# Patient Record
Sex: Female | Born: 1965 | Race: White | Hispanic: No | Marital: Married | Smoking: Current every day smoker
Health system: Southern US, Community
[De-identification: ages and names within clinical notes are randomized; demographics above are authoritative.]

## PROBLEM LIST (undated history)

## (undated) DIAGNOSIS — G35 Multiple sclerosis: Secondary | ICD-10-CM

## (undated) DIAGNOSIS — K219 Gastro-esophageal reflux disease without esophagitis: Secondary | ICD-10-CM

## (undated) DIAGNOSIS — N289 Disorder of kidney and ureter, unspecified: Secondary | ICD-10-CM

## (undated) DIAGNOSIS — F32A Depression, unspecified: Secondary | ICD-10-CM

## (undated) DIAGNOSIS — F419 Anxiety disorder, unspecified: Secondary | ICD-10-CM

## (undated) DIAGNOSIS — R413 Other amnesia: Secondary | ICD-10-CM

## (undated) DIAGNOSIS — E7800 Pure hypercholesterolemia, unspecified: Secondary | ICD-10-CM

## (undated) DIAGNOSIS — H539 Unspecified visual disturbance: Secondary | ICD-10-CM

## (undated) DIAGNOSIS — F329 Major depressive disorder, single episode, unspecified: Secondary | ICD-10-CM

## (undated) DIAGNOSIS — G500 Trigeminal neuralgia: Secondary | ICD-10-CM

## (undated) HISTORY — DX: Multiple sclerosis: G35

## (undated) HISTORY — DX: Anxiety disorder, unspecified: F41.9

## (undated) HISTORY — DX: Gastro-esophageal reflux disease without esophagitis: K21.9

## (undated) HISTORY — PX: ABDOMINAL HYSTERECTOMY: SHX81

## (undated) HISTORY — DX: Other amnesia: R41.3

## (undated) HISTORY — DX: Trigeminal neuralgia: G50.0

## (undated) HISTORY — DX: Pure hypercholesterolemia, unspecified: E78.00

## (undated) HISTORY — DX: Depression, unspecified: F32.A

## (undated) HISTORY — DX: Unspecified visual disturbance: H53.9

## (undated) HISTORY — DX: Major depressive disorder, single episode, unspecified: F32.9

---

## 2008-04-03 ENCOUNTER — Ambulatory Visit: Payer: Self-pay | Admitting: Cardiology

## 2008-04-14 ENCOUNTER — Ambulatory Visit: Payer: Self-pay | Admitting: Cardiology

## 2008-04-28 ENCOUNTER — Ambulatory Visit: Payer: Self-pay | Admitting: Cardiology

## 2010-11-29 NOTE — Assessment & Plan Note (Signed)
[facility]                          [facility] NOTE   MAEBELL, LYVERS                      MRN:          [id number]  DATE:04/03/2008                            DOB:          02/04/1966    Mrs. Megan Chan is a pleasant 45 year old female who I am asked to evaluate  for chest pain.  She has no prior cardiac history.  Note, this year she  has been diagnosed with multiple sclerosis.  She also has a trigeminal  neuralgia.  She has had pain in her chest since approximately January.  The pain is more in the right chest area and is described as a  heaviness.  It also radiates to the neck.  She has some shortness of  breath, but there is no nausea, vomiting, or diaphoresis.  Pain is not  pleuritic or positional nor is it related to food.  It is not clearly  exertional.  It can last anywhere from 2 hours to all day at a time.  There is no relieving factors.  She was seen by Dr. Neita Carp, and  electrocardiogram was felt to be abnormal.  We were asked to further  evaluate.  Note, she also has some dyspnea on exertion, but there is no  orthopnea, PND, or pedal edema.  She has not had syncope.   Her medications include also Betaseron 0.3 mg p.o. every other day,  fluoxetine 20 mg p.o. daily, gabapentin 300 mg p.o. t.i.d., Pravachol 40  mg p.o. nightly, prenatal vitamins, vitamin B12, vitamin C, salmon oil,  Caltrate, and Tylenol.   Her allergies include PENICILLIN, SULFA, and CODEINE.   SOCIAL HISTORY:  She does smoke.  She states she used to occasionally  consume alcohol, she does not consume any at this point.  She is  married, but has no children.   Her family history is negative for coronary artery disease in her  immediate family.   PAST MEDICAL HISTORY:  There is no diabetes mellitus or hypertension,  but she does have a history of hyperlipidemia.  She was recently  diagnosed with multiple sclerosis and has a history of trigeminal  neuralgia.  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  She states she has not been able to have children and has had a  previous evaluation for this.  She has had no previous surgeries.   REVIEW OF SYSTEMS:  She has not had headaches or fevers or chills.  She  has had some problems with pain in the right base described as pin  sensation related to trigeminal neuralgia.  She does have a cough, but  there is no hemoptysis.  There is no dysphagia, odynophagia, melena, or  hematochezia.  There is no dysuria or  hematuria.  There is no rash or  seizure activity.  There is no orthopnea, PND, or pedal edema.  She has  diffuse pains from her multiple sclerosis.  The remaining systems are  negative.   PHYSICAL EXAMINATION:  VITAL SIGNS:  Today shows a blood pressure 128/73  and her pulse is 69.  She  weighs 115 pounds.  GENERAL:  She is well developed and well nourished in no distress.  SKIN:  Warm and dry.  She does not appear to be depressed.  There is no  peripheral clubbing  BACK:  Normal.  HEENT:  Normal with normal eyelids.  NECK:  Supple with a normal upstroke bilaterally.  No bruits noted.  There is no jugular venous distention, and I cannot appreciate  thyromegaly.  CHEST:  Clear to auscultation.  No expansion.  CARDIOVASCULAR:  Regular rare and rhythm.  Normal S1 and S2.  There are  no murmurs, rubs, or gallops noted.  ABDOMEN:  Nontender, nondistended.  Positive bowel sounds.  No  hepatosplenomegaly.  No mass appreciated.  There is no abdominal bruit.  EXTREMITIES:  She has 2+ femoral pulses bilaterally.  No bruits.  Extremities show no edema and I can palpate no cords.  She has 2+  dorsalis pedis pulses bilaterally.  NEUROLOGIC:  Grossly intact.   I have reviewed her electrocardiogram from February 25, 2008.  This showed  a normal sinus rhythm at a rate of 58.  The axis is normal.  A prior  septal infarct cannot be excluded.  There are no ST changes  noted.   DIAGNOSES:  1. Chest pain - the patient's symptoms are atypical, it may be related      to multiple sclerosis.  I think we can proceed with a stress      echocardiogram for risk stratification particularly in light of her      dyspnea.  If it is normal, then I would not pursue further cardiac      evaluation.  2. Multiple sclerosis - management per her neurologist.  3. Abnormal electrocardiogram - as per above, we will plan a stress      echocardiogram.  It is normal, we will not pursue further      evaluation.  4. Hyperlipidemia - she will continue her statin and this is being      managed by Dr. Neita Carp.   We will see her back in 4 weeks to review her stress echo with her.     Megan Frieze Jens Som, MD, [facility]  Electronically Signed    BSC/MedQ  DD: 04/03/2008  DT: 04/04/2008  Job #: [id number]   cc:   Fara Chute

## 2010-11-29 NOTE — Assessment & Plan Note (Signed)
[facility]                          [facility]   [name], Chan                      MRN:          [id number]  DATE:04/28/2008                            DOB:          [birth year]    PRIMARY CARDIOLOGIST:  Madolyn Frieze. Jens Som, MD, [facility]   PRIMARY CARE PHYSICIAN:  Fara Chute, MD   REASON FOR VISIT:  Followup cardiac testing.   HISTORY OF PRESENT ILLNESS:  [name] was seen back in September by  Dr. Jens Som with a history of atypical chest pain in the setting of  multiple sclerosis and a resting electrocardiogram demonstrating sinus  rhythm with poor septal R-wave progression.  She was referred for an  exercise echocardiogram which was performed on April 14, 2008.  The  patient achieved a maximum workload of 10 METS without chest pain and  had a negative electrocardiographic response.  She also had normal left  ventricular systolic function with no inducible wall motion  abnormalities to suggest ischemia echocardiographically.  I have  reviewed these very reassuring results with the patient today, and at  this point, I would not anticipate any further cardiac evaluation.  She  was very comfortable with this.   ALLERGIES:  PENICILLIN, SULFA drugs, and CODEINE.   PRESENT MEDICATIONS:  1. Betaseron 0.3 mg p.o. every other day.  2. Fluoxetine 20 mg p.o. daily.  3. Gabapentin 300 mg p.o. t.i.d.  4. Pravastatin 40 mg p.o. nightly.  5. Prenatal vitamin daily.  6. Vitamin B12 1000 mg daily.  7. Vitamin C 1000 mg daily.  8. Salmon oil 1000 mg p.o. b.i.d.  9. Caltrate with vitamin D 600 mg p.o. b.i.d.  10.Extra-strength Tylenol 500 mg 2 tablets p.o. every other day.   REVIEW OF SYSTEMS:  As described in the history of present illness,  otherwise negative.   PHYSICAL EXAMINATION:  Blood pressure 123/72, heart rate is 67, weight  is 118 pounds.  Otherwise, no significant changes.   IMPRESSION AND RECOMMENDATIONS:  Atypical  chest pain with reassuring  cardiac evaluation including a normal exercise echocardiogram.  At this  point, we would not anticipate any further cardiac evaluation.  She will  continue followup with her neurologist for ongoing  management of multiple sclerosis and also with Dr. Neita Carp.  Cardiology  followup can be as needed.     Jonelle Sidle, MD  Electronically Signed    SGM/MedQ  DD: 04/28/2008  DT: 04/29/2008  Job #: [id number]   cc:   Madolyn Frieze. Jens Som, MD, [facility]  Fara Chute

## 2013-07-17 HISTORY — PX: COLONOSCOPY: SHX174

## 2014-08-25 ENCOUNTER — Encounter: Payer: Self-pay | Admitting: Neurology

## 2014-08-25 ENCOUNTER — Ambulatory Visit (INDEPENDENT_AMBULATORY_CARE_PROVIDER_SITE_OTHER): Payer: 59 | Admitting: Neurology

## 2014-08-25 VITALS — BP 128/72 | HR 66 | Resp 14 | Ht 63.0 in | Wt 137.2 lb

## 2014-08-25 DIAGNOSIS — G501 Atypical facial pain: Secondary | ICD-10-CM

## 2014-08-25 DIAGNOSIS — R350 Frequency of micturition: Secondary | ICD-10-CM

## 2014-08-25 DIAGNOSIS — F418 Other specified anxiety disorders: Secondary | ICD-10-CM | POA: Insufficient documentation

## 2014-08-25 DIAGNOSIS — R5383 Other fatigue: Secondary | ICD-10-CM

## 2014-08-25 DIAGNOSIS — G35 Multiple sclerosis: Secondary | ICD-10-CM

## 2014-08-25 DIAGNOSIS — Z79899 Other long term (current) drug therapy: Secondary | ICD-10-CM

## 2014-08-25 DIAGNOSIS — R261 Paralytic gait: Secondary | ICD-10-CM

## 2014-08-25 MED ORDER — IMIPRAMINE HCL 25 MG PO TABS: 25.0000 mg | ORAL_TABLET | Freq: Every day | ORAL | Status: DC
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
## 2014-08-26 LAB — CBC WITH DIFFERENTIAL/PLATELET
BASOS ABS: 0 10*3/uL (ref 0.0–0.2)
Basos: 0 %
EOS ABS: 0.2 10*3/uL (ref 0.0–0.4)
Eos: 2 %
HCT: 41.4 % (ref 34.0–46.6)
Hemoglobin: 14.2 g/dL (ref 11.1–15.9)
IMMATURE GRANS (ABS): 0 10*3/uL (ref 0.0–0.1)
IMMATURE GRANULOCYTES: 0 %
LYMPHS: 33 %
Lymphocytes Absolute: 3.6 10*3/uL — ABNORMAL HIGH (ref 0.7–3.1)
MCH: 32.3 pg (ref 26.6–33.0)
MCHC: 34.3 g/dL (ref 31.5–35.7)
MCV: 94 fL (ref 79–97)
MONOS ABS: 0.6 10*3/uL (ref 0.1–0.9)
Monocytes: 6 %
NEUTROS PCT: 59 %
Neutrophils Absolute: 6.3 10*3/uL (ref 1.4–7.0)
PLATELETS: 229 10*3/uL (ref 150–379)
RBC: 4.4 x10E6/uL (ref 3.77–5.28)
RDW: 14.2 % (ref 12.3–15.4)
WBC: 10.8 10*3/uL (ref 3.4–10.8)

## 2014-08-26 LAB — COMPREHENSIVE METABOLIC PANEL
ALT: 28 IU/L (ref 0–32)
AST: 23 IU/L (ref 0–40)
Albumin/Globulin Ratio: 1.9 (ref 1.1–2.5)
Albumin: 4.6 g/dL (ref 3.5–5.5)
Alkaline Phosphatase: 116 IU/L (ref 39–117)
BILIRUBIN TOTAL: 0.4 mg/dL (ref 0.0–1.2)
BUN/Creatinine Ratio: 7 — ABNORMAL LOW (ref 9–23)
BUN: 5 mg/dL — ABNORMAL LOW (ref 6–24)
CALCIUM: 9.7 mg/dL (ref 8.7–10.2)
CO2: 25 mmol/L (ref 18–29)
Chloride: 100 mmol/L (ref 97–108)
Creatinine, Ser: 0.75 mg/dL (ref 0.57–1.00)
GFR, EST AFRICAN AMERICAN: 110 mL/min/{1.73_m2} (ref >59)
GFR, EST NON AFRICAN AMERICAN: 95 mL/min/{1.73_m2} (ref >59)
GLOBULIN, TOTAL: 2.4 g/dL (ref 1.5–4.5)
GLUCOSE: 84 mg/dL (ref 65–99)
POTASSIUM: 4.5 mmol/L (ref 3.5–5.2)
SODIUM: 139 mmol/L (ref 134–144)
Total Protein: 7 g/dL (ref 6.0–8.5)

## 2014-08-28 LAB — QUANTIFERON IN TUBE
QFT TB AG MINUS NIL VALUE: 0.01 [IU]/mL
QUANTIFERON MITOGEN VALUE: 7.64 IU/mL
QUANTIFERON TB AG VALUE: 0.02 IU/mL
QUANTIFERON TB GOLD: NEGATIVE
Quantiferon Nil Value: 0.01 IU/mL

## 2014-08-28 LAB — QUANTIFERON TB GOLD ASSAY (BLOOD)

## 2014-09-01 ENCOUNTER — Encounter: Payer: Self-pay | Admitting: *Deleted

## 2014-09-01 ENCOUNTER — Telehealth: Payer: Self-pay | Admitting: *Deleted

## 2014-09-01 NOTE — Telephone Encounter (Signed)
Spoke with Thayer Headings and per RAS, advised labs are ok, I have faxed Aubagio start form in to [place].  She verbalized understanding of same/fim

## 2014-09-03 ENCOUNTER — Ambulatory Visit (INDEPENDENT_AMBULATORY_CARE_PROVIDER_SITE_OTHER): Payer: 59

## 2014-09-03 DIAGNOSIS — G35 Multiple sclerosis: Secondary | ICD-10-CM

## 2014-09-03 DIAGNOSIS — R261 Paralytic gait: Secondary | ICD-10-CM

## 2014-09-03 DIAGNOSIS — Z79899 Other long term (current) drug therapy: Secondary | ICD-10-CM

## 2014-09-03 MED ORDER — GADOPENTETATE DIMEGLUMINE 469.01 MG/ML IV SOLN: 13.0000 mL | Freq: Once | INTRAVENOUS | Status: AC | PRN

## 2014-09-04 ENCOUNTER — Telehealth: Payer: Self-pay | Admitting: Neurology

## 2014-09-04 NOTE — Telephone Encounter (Signed)
I LM   MRI showed some new lesions so good she is going back on a medication .Marland Kitchen  If she does not get her med's within 2 weeks let us know

## 2014-09-14 ENCOUNTER — Telehealth: Payer: Self-pay | Admitting: Neurology

## 2014-09-14 NOTE — Telephone Encounter (Signed)
Spoke with Thayer Headings.  I will check with Aubagio to see if they need anything else from [place].  She has questions regarding her last mri--RAS left a message for her.  I will let him know she is available at [phone] to discuss mri./fim

## 2014-09-14 NOTE — Telephone Encounter (Signed)
The pt is calling stating that her insurance will not pay for Aubagio she needs for Dr. Felecia Shelling to contact [facility].  Also pt has questions regarding her MRI results and the message that was left.  Please call and advise.

## 2014-09-14 NOTE — Telephone Encounter (Signed)
I spoke to Megan Chan about her MRI (showing 4 brand new lesions --- off any med x 9 months).  We are having some trouble getting Aubagio but she has another week of samples from [facility].       Is there something I need to fill out or person to call so we can get Aubagio -- if they won't conver despite appeal, we will need to have her do something else

## 2014-09-15 NOTE — Telephone Encounter (Signed)
Angelic with [facility] @ [phone], x [phone], stated she faxed on 2/26 outcome of prior authorization with [employer].  She stated form needs to be completed and faxed back.  Please call and advise.

## 2014-09-15 NOTE — Telephone Encounter (Signed)
I called back.  Spoke with Reece Levy who transferred me to [place].  She requested we send a copy of PA form sent to ins on 02/25 to her at [phone].  She indicated the patient has been approved for the co-pay assist program effective until 09/07/2015 Co-pay ID # [id number] Group # [id number] and BIN # [id number].

## 2014-09-15 NOTE — Telephone Encounter (Signed)
Spoke with Angelique with [facility]--she sts. she needs update on status of pa for Aubagio.  I will ask Janett Billow for this/fim

## 2014-09-15 NOTE — Telephone Encounter (Signed)
LMOM for Megan Chan (identified vm) that I have spoken with Megan Chan at [place] [facility]--she sts. pa for Megan Chan has been submitted to Megan Chan's ins--just waiting for it to go thru.  I will call Megan Chan as soon as I have another update/fim

## 2014-09-17 NOTE — Telephone Encounter (Signed)
Prior auth for Megan Chan was approved by ins effective until 09/15/2014 Ref # [id number].  Angelique at [facility] is aware, and they are contacting the patient.

## 2014-09-17 NOTE — Telephone Encounter (Signed)
Please disregard previous note.

## 2014-09-18 ENCOUNTER — Telehealth: Payer: Self-pay | Admitting: Neurology

## 2014-09-18 NOTE — Telephone Encounter (Signed)
Pt is calling wanting to know if imipramine (TOFRANIL) 25 MG tablet causes a yeast infection?  She has had vaginal dryness and pain and some bleeding.  Vaginal area was swollen and very dry or could it be the Aubagio. This did not start until she started these 2 medications.  She did but some OTC meds for yeast infection, but now is having some itching problems now.  She stopped taking the imipramine. Please call and advise.

## 2014-09-18 NOTE — Telephone Encounter (Signed)
Spoke with Thayer Headings and per RAS, advised vaginal sx. likely not due to Aubagio or Imipramine.  She verbalized understanding of same, asked for med for yeast infection to be called in.  I advised she would need to see her ob/gyn or pcp for this/fim

## 2014-09-18 NOTE — Telephone Encounter (Signed)
Ins has approved the request for Aubagio effective until 45/62/5638 or until policy changes or is terminated Ref # [id number].  Ryanne from [place] has reached out to the patient.

## 2014-11-19 ENCOUNTER — Telehealth: Payer: Self-pay | Admitting: *Deleted

## 2014-11-19 DIAGNOSIS — G35 Multiple sclerosis: Secondary | ICD-10-CM

## 2014-11-19 DIAGNOSIS — Z79899 Other long term (current) drug therapy: Secondary | ICD-10-CM

## 2014-11-19 NOTE — Telephone Encounter (Signed)
I have spoken with Megan Chan this am--she would like liver panel checked at pcp's office--Dr. Consuello Masse at [facility] in [place].  I advised this is ok--I have spoken with Dr. Edythe Lynn office and they are agreeable to drawing labs tomorrow at 1115.  Megan Chan aware of appt., is agreeable.  Order for liver panel faxed to Dr. Edythe Lynn office at fax# [phone]/fim

## 2014-11-24 ENCOUNTER — Encounter: Payer: Self-pay | Admitting: Neurology

## 2014-11-24 ENCOUNTER — Ambulatory Visit (INDEPENDENT_AMBULATORY_CARE_PROVIDER_SITE_OTHER): Payer: 59 | Admitting: Neurology

## 2014-11-24 ENCOUNTER — Other Ambulatory Visit: Payer: Self-pay | Admitting: *Deleted

## 2014-11-24 VITALS — BP 124/60 | HR 68 | Resp 14 | Ht 64.0 in | Wt 136.4 lb

## 2014-11-24 DIAGNOSIS — Z79899 Other long term (current) drug therapy: Secondary | ICD-10-CM

## 2014-11-24 DIAGNOSIS — G501 Atypical facial pain: Secondary | ICD-10-CM | POA: Diagnosis not present

## 2014-11-24 DIAGNOSIS — R5383 Other fatigue: Secondary | ICD-10-CM | POA: Diagnosis not present

## 2014-11-24 DIAGNOSIS — M542 Cervicalgia: Secondary | ICD-10-CM | POA: Diagnosis not present

## 2014-11-24 DIAGNOSIS — F418 Other specified anxiety disorders: Secondary | ICD-10-CM

## 2014-11-24 DIAGNOSIS — R261 Paralytic gait: Secondary | ICD-10-CM

## 2014-11-24 DIAGNOSIS — G35 Multiple sclerosis: Secondary | ICD-10-CM

## 2014-11-24 DIAGNOSIS — R350 Frequency of micturition: Secondary | ICD-10-CM

## 2014-11-24 MED ORDER — GABAPENTIN 600 MG PO TABS: 600.0000 mg | ORAL_TABLET | Freq: Four times a day (QID) | ORAL | Status: DC

## 2014-11-24 MED ORDER — LAMOTRIGINE 25 MG PO TABS: ORAL_TABLET | ORAL | Status: DC

## 2014-11-24 MED ORDER — LAMOTRIGINE 100 MG PO TABS: 100.0000 mg | ORAL_TABLET | Freq: Every day | ORAL | Status: DC

## 2014-11-24 NOTE — Patient Instructions (Signed)
Lamotrigine 25 mg: Take one pill daily x 1 week, then one pill twice daily x 1 week, then one pill three times daily x 1 week, then 2 pills twice daily x 1 week, then start other prescription  Then go up to 100 mg tablet twice daily  Reduce Aubagio to other day.   Recheck  bloodwork in 3 weeks
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
## 2014-12-11 ENCOUNTER — Telehealth: Payer: Self-pay | Admitting: Neurology

## 2014-12-11 MED ORDER — LAMOTRIGINE 100 MG PO TABS: ORAL_TABLET | ORAL | Status: DC

## 2014-12-11 NOTE — Telephone Encounter (Signed)
Patient called wanting to clarify the dosage she is suppose to be taking for lamoTRIgine (LAMICTAL) 100 MG tablet. Please call and advise. Patient can be reached @ [phone]

## 2014-12-11 NOTE — Telephone Encounter (Signed)
I have spoken with Megan Chan this am.  She was to titrate Lamictal up to 100mg  bid.  Written rx. has a quantity of #60 but instructions say to take one tab daily.  She understands to take one tab bid but is concerned ins. will not cover #60 with current instructions, and she is probably right.  I have escribed a new rx. to [facility] in [place] for her./fim

## 2015-01-19 ENCOUNTER — Telehealth: Payer: Self-pay | Admitting: Neurology

## 2015-01-19 NOTE — Telephone Encounter (Signed)
Pt called wanting the  start dates on lamoTRIgine (LAMICTAL) 100 MG tablet,Teriflunomide 14 MG TABS, pt needs to know the reason for taking them as well. Please call and advise. [phone]

## 2015-01-19 NOTE — Telephone Encounter (Signed)
I called back.  Answered patient's questions.  She will call back if anything further is needed.

## 2015-01-26 ENCOUNTER — Telehealth: Payer: Self-pay | Admitting: Neurology

## 2015-01-26 NOTE — Telephone Encounter (Signed)
Pt called wondering about her medication, she is very confused about this. Please call back and advise [phone]

## 2015-01-26 NOTE — Telephone Encounter (Signed)
I have spoken with Megan Chan this afternoon-she is having a new set of lft's drawn at her pcp's office this week--currently she is taking Aubagio qod, would like to know if she should increase to daily if lft's are normal.  I will watch for results and check with RAS when they arrive/fim

## 2015-02-01 ENCOUNTER — Telehealth: Payer: Self-pay | Admitting: *Deleted

## 2015-02-01 NOTE — Telephone Encounter (Signed)
Received labs (liver profile) from pcp Dr. Quintin Alto.  Total protein is 6.3     Serum albumin is 4.4      Total bilirubin is 0.3      Direct bilirubin is 0.10      Alkaline Phosphatase is high at 139.     AST is 26     ALT is 25.  I have spoken with Megan Chan and per RAS, advised that labs are ok; she should continue Aubagio qod until she sees him back for follow up, then will re-eval and may go back to taking Aubagio daily.  She verbalized understanding of same/fim

## 2015-02-25 ENCOUNTER — Ambulatory Visit: Payer: 59 | Admitting: Neurology

## 2015-03-11 ENCOUNTER — Other Ambulatory Visit: Payer: Self-pay | Admitting: *Deleted

## 2015-03-11 ENCOUNTER — Ambulatory Visit (INDEPENDENT_AMBULATORY_CARE_PROVIDER_SITE_OTHER): Payer: Medicare Other | Admitting: Neurology

## 2015-03-11 ENCOUNTER — Encounter: Payer: Self-pay | Admitting: Neurology

## 2015-03-11 VITALS — BP 118/62 | HR 76 | Resp 14 | Ht 64.0 in | Wt 130.6 lb

## 2015-03-11 DIAGNOSIS — G35 Multiple sclerosis: Secondary | ICD-10-CM

## 2015-03-11 DIAGNOSIS — R5383 Other fatigue: Secondary | ICD-10-CM

## 2015-03-11 DIAGNOSIS — Z79899 Other long term (current) drug therapy: Secondary | ICD-10-CM

## 2015-03-11 DIAGNOSIS — R350 Frequency of micturition: Secondary | ICD-10-CM | POA: Diagnosis not present

## 2015-03-11 DIAGNOSIS — G501 Atypical facial pain: Secondary | ICD-10-CM

## 2015-03-11 DIAGNOSIS — R261 Paralytic gait: Secondary | ICD-10-CM

## 2015-03-11 MED ORDER — CYCLOBENZAPRINE HCL 5 MG PO TABS: 5.0000 mg | ORAL_TABLET | Freq: Three times a day (TID) | ORAL | Status: DC | PRN

## 2015-03-11 MED ORDER — LAMOTRIGINE 150 MG PO TABS: ORAL_TABLET | ORAL | Status: DC
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
## 2015-03-12 ENCOUNTER — Telehealth: Payer: Self-pay

## 2015-03-12 ENCOUNTER — Telehealth: Payer: Self-pay | Admitting: *Deleted

## 2015-03-12 ENCOUNTER — Other Ambulatory Visit: Payer: Self-pay | Admitting: Neurology

## 2015-03-12 LAB — CBC WITH DIFFERENTIAL/PLATELET
BASOS: 0 %
Basophils Absolute: 0 10*3/uL (ref 0.0–0.2)
EOS (ABSOLUTE): 0.2 10*3/uL (ref 0.0–0.4)
EOS: 4 %
HEMOGLOBIN: 13.7 g/dL (ref 11.1–15.9)
Hematocrit: 41.3 % (ref 34.0–46.6)
IMMATURE GRANS (ABS): 0 10*3/uL (ref 0.0–0.1)
IMMATURE GRANULOCYTES: 0 %
LYMPHS: 36 %
Lymphocytes Absolute: 2.2 10*3/uL (ref 0.7–3.1)
MCH: 30.6 pg (ref 26.6–33.0)
MCHC: 33.2 g/dL (ref 31.5–35.7)
MCV: 92 fL (ref 79–97)
MONOCYTES: 10 %
Monocytes Absolute: 0.6 10*3/uL (ref 0.1–0.9)
NEUTROS ABS: 3 10*3/uL (ref 1.4–7.0)
NEUTROS PCT: 50 %
PLATELETS: 254 10*3/uL (ref 150–379)
RBC: 4.47 x10E6/uL (ref 3.77–5.28)
RDW: 14.1 % (ref 12.3–15.4)
WBC: 6 10*3/uL (ref 3.4–10.8)

## 2015-03-12 LAB — HEPATIC FUNCTION PANEL
ALK PHOS: 171 IU/L — AB (ref 39–117)
ALT: 43 IU/L — AB (ref 0–32)
AST: 35 IU/L (ref 0–40)
Albumin: 4.4 g/dL (ref 3.5–5.5)
BILIRUBIN TOTAL: 0.3 mg/dL (ref 0.0–1.2)
BILIRUBIN, DIRECT: 0.1 mg/dL (ref 0.00–0.40)
Total Protein: 6.8 g/dL (ref 6.0–8.5)

## 2015-03-12 MED ORDER — LAMOTRIGINE 100 MG PO TABS: ORAL_TABLET | ORAL | Status: DC

## 2015-03-12 MED ORDER — LAMOTRIGINE 150 MG PO TABS: ORAL_TABLET | ORAL | Status: DC

## 2015-03-12 NOTE — Telephone Encounter (Signed)
LMOM identified vm that per RAS, lft's are still slightly high, so she should continue Aubagio qod for now, will recheck at next ov.  She does not need to return this call unless she has questions/fim

## 2015-03-12 NOTE — Telephone Encounter (Signed)
Per ov note yesterday, RAS ordered increase in lamictal to 200mg  po bid.  I have escribed this rx. to [facility]/fim

## 2015-03-12 NOTE — Addendum Note (Signed)
Addended by: France Ravens I on: 03/12/2015 10:32 AM   Modules accepted: Orders

## 2015-03-12 NOTE — Telephone Encounter (Signed)
Informed patient of slightly elevated Liver test and to remain on Aubagio every other day.  Also that we would re check liver test on her next visit;  she verbalized understanding.  Patient is requesting script for the increase of Lamotrigine 200mg  PO BID.

## 2015-03-12 NOTE — Telephone Encounter (Signed)
-----   Message from Britt Bottom, MD sent at 03/12/2015  8:40 AM EDT ----- Liver tests is still slightly high so stay on every other day Aubagio for now. We'll recheck at the next visit.

## 2015-04-22 DIAGNOSIS — R350 Frequency of micturition: Secondary | ICD-10-CM | POA: Diagnosis not present

## 2015-04-22 DIAGNOSIS — R1011 Right upper quadrant pain: Secondary | ICD-10-CM | POA: Diagnosis not present

## 2015-04-22 DIAGNOSIS — B351 Tinea unguium: Secondary | ICD-10-CM | POA: Diagnosis not present

## 2015-04-22 DIAGNOSIS — M545 Low back pain: Secondary | ICD-10-CM | POA: Diagnosis not present

## 2015-04-30 DIAGNOSIS — Z905 Acquired absence of kidney: Secondary | ICD-10-CM | POA: Diagnosis not present

## 2015-04-30 DIAGNOSIS — R1011 Right upper quadrant pain: Secondary | ICD-10-CM | POA: Diagnosis not present

## 2015-04-30 DIAGNOSIS — Z1231 Encounter for screening mammogram for malignant neoplasm of breast: Secondary | ICD-10-CM | POA: Diagnosis not present

## 2015-05-31 ENCOUNTER — Telehealth: Payer: Self-pay | Admitting: Neurology

## 2015-05-31 NOTE — Telephone Encounter (Signed)
I have spoken with Megan Chan and advised she call [employer] at [phone] and advised them she has new insurance--[employer] will then do a new benefits investigation to see if [name] will be covered, and if not, can get her signed up for pt. assistance.  She verbalized understanding of same/fim

## 2015-05-31 NOTE — Telephone Encounter (Signed)
Patient called to advise she is now on Medicare and would like to speak to nurse regarding how she is going to go about getting MS medication. Medicare does not cover Teriflunomide 14 MG TABS.

## 2015-06-01 DIAGNOSIS — R5383 Other fatigue: Secondary | ICD-10-CM | POA: Diagnosis not present

## 2015-06-01 DIAGNOSIS — F411 Generalized anxiety disorder: Secondary | ICD-10-CM | POA: Diagnosis not present

## 2015-06-01 DIAGNOSIS — F4001 Agoraphobia with panic disorder: Secondary | ICD-10-CM | POA: Diagnosis not present

## 2015-06-01 DIAGNOSIS — Z13220 Encounter for screening for lipoid disorders: Secondary | ICD-10-CM | POA: Diagnosis not present

## 2015-06-01 DIAGNOSIS — G35 Multiple sclerosis: Secondary | ICD-10-CM | POA: Diagnosis not present

## 2015-06-01 DIAGNOSIS — G500 Trigeminal neuralgia: Secondary | ICD-10-CM | POA: Diagnosis not present

## 2015-06-01 DIAGNOSIS — R7989 Other specified abnormal findings of blood chemistry: Secondary | ICD-10-CM | POA: Diagnosis not present

## 2015-06-09 ENCOUNTER — Telehealth: Payer: Self-pay | Admitting: Neurology

## 2015-06-09 MED ORDER — CYCLOBENZAPRINE HCL 5 MG PO TABS: 5.0000 mg | ORAL_TABLET | Freq: Three times a day (TID) | ORAL | Status: DC | PRN

## 2015-06-09 NOTE — Telephone Encounter (Signed)
Patient is calling to get a new Rx called in for cyclobenzaprine (FLEXERIL) 5 MG tablet a 90 day supply. The patient is changing over to Medicare. Please call to [facility] in [place]. Thank you.

## 2015-06-09 NOTE — Telephone Encounter (Signed)
Rx has been sent for 90 day supply per patient request.  Receipt confirmed by pharmacy.  

## 2015-06-21 ENCOUNTER — Other Ambulatory Visit: Payer: Self-pay | Admitting: *Deleted

## 2015-06-21 MED ORDER — TERIFLUNOMIDE 14 MG PO TABS: 14.0000 mg | ORAL_TABLET | Freq: Every day | ORAL | Status: DC

## 2015-06-21 NOTE — Telephone Encounter (Signed)
Aubagio rx. printed, signed, faxed back to [facility] (they needed rx. for pt. assistance purposes.) Fax # [phone]

## 2015-07-15 ENCOUNTER — Ambulatory Visit (INDEPENDENT_AMBULATORY_CARE_PROVIDER_SITE_OTHER): Payer: Medicare Other | Admitting: Neurology

## 2015-07-15 ENCOUNTER — Encounter: Payer: Self-pay | Admitting: Neurology

## 2015-07-15 VITALS — BP 123/76 | HR 72 | Ht 63.0 in | Wt 137.0 lb

## 2015-07-15 DIAGNOSIS — F418 Other specified anxiety disorders: Secondary | ICD-10-CM | POA: Diagnosis not present

## 2015-07-15 DIAGNOSIS — G501 Atypical facial pain: Secondary | ICD-10-CM

## 2015-07-15 DIAGNOSIS — Z79899 Other long term (current) drug therapy: Secondary | ICD-10-CM

## 2015-07-15 DIAGNOSIS — R5383 Other fatigue: Secondary | ICD-10-CM

## 2015-07-15 DIAGNOSIS — R261 Paralytic gait: Secondary | ICD-10-CM | POA: Diagnosis not present

## 2015-07-15 DIAGNOSIS — G35 Multiple sclerosis: Secondary | ICD-10-CM

## 2015-07-15 DIAGNOSIS — R350 Frequency of micturition: Secondary | ICD-10-CM

## 2015-07-15 MED ORDER — LAMOTRIGINE 200 MG PO TABS: ORAL_TABLET | ORAL | Status: DC
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
## 2015-09-29 DIAGNOSIS — E7800 Pure hypercholesterolemia, unspecified: Secondary | ICD-10-CM | POA: Diagnosis not present

## 2015-09-29 DIAGNOSIS — E119 Type 2 diabetes mellitus without complications: Secondary | ICD-10-CM | POA: Diagnosis not present

## 2015-10-05 DIAGNOSIS — F4001 Agoraphobia with panic disorder: Secondary | ICD-10-CM | POA: Diagnosis not present

## 2015-10-05 DIAGNOSIS — E782 Mixed hyperlipidemia: Secondary | ICD-10-CM | POA: Diagnosis not present

## 2015-10-05 DIAGNOSIS — G500 Trigeminal neuralgia: Secondary | ICD-10-CM | POA: Diagnosis not present

## 2015-10-05 DIAGNOSIS — F411 Generalized anxiety disorder: Secondary | ICD-10-CM | POA: Diagnosis not present

## 2015-10-05 DIAGNOSIS — R945 Abnormal results of liver function studies: Secondary | ICD-10-CM | POA: Diagnosis not present

## 2015-10-05 DIAGNOSIS — G35 Multiple sclerosis: Secondary | ICD-10-CM | POA: Diagnosis not present

## 2015-12-17 ENCOUNTER — Encounter: Payer: Self-pay | Admitting: *Deleted

## 2015-12-22 ENCOUNTER — Telehealth: Payer: Self-pay | Admitting: *Deleted

## 2015-12-22 NOTE — Telephone Encounter (Signed)
[place].  She has an appt. with RAS on 01-13-16 at 1620.  He will be out of the office during that time.  I just need to r/s her appt.  Any available 20 min. f/u spot will do/fim

## 2016-01-03 DIAGNOSIS — R300 Dysuria: Secondary | ICD-10-CM | POA: Diagnosis not present

## 2016-01-03 DIAGNOSIS — J0101 Acute recurrent maxillary sinusitis: Secondary | ICD-10-CM | POA: Diagnosis not present

## 2016-01-03 DIAGNOSIS — J069 Acute upper respiratory infection, unspecified: Secondary | ICD-10-CM | POA: Diagnosis not present

## 2016-01-04 ENCOUNTER — Telehealth: Payer: Self-pay | Admitting: Neurology

## 2016-01-04 NOTE — Telephone Encounter (Signed)
I have spoken with [employer] and advised new srf was only faxed in at [facility] request, due to new ins. coverage.  There is no change in rx/fim

## 2016-01-04 NOTE — Telephone Encounter (Signed)
Megan Chan/Aubagio [phone] calling rec'd referral but needs to confirm some of the information before they can continue processing. Please call asap

## 2016-01-08 DIAGNOSIS — M791 Myalgia: Secondary | ICD-10-CM | POA: Diagnosis not present

## 2016-01-08 DIAGNOSIS — R509 Fever, unspecified: Secondary | ICD-10-CM | POA: Diagnosis not present

## 2016-01-13 ENCOUNTER — Ambulatory Visit: Payer: 59 | Admitting: Neurology

## 2016-01-17 ENCOUNTER — Telehealth: Payer: Self-pay | Admitting: Neurology

## 2016-01-17 NOTE — Telephone Encounter (Signed)
Megan Chan/ Megan Chan called and says she spoke with the pt about her Aubagio delivery and pt told Megan Chan she currently has [place] spotted fever. Megan Chan also wants to know if pt has had a recent TB test done. Pt is also on Crestor- there are some reactions with the two medications. Phone: [phone]

## 2016-01-19 NOTE — Telephone Encounter (Signed)
I have spoken with pharmacist at [facility]. and advised pt. had neg. TB Gold prior to starting Aubagio.  RAS is aware pt. is on both Crestor and Aubagio. She has a pending appt. with him on 01-26-16/fim

## 2016-01-26 ENCOUNTER — Ambulatory Visit (INDEPENDENT_AMBULATORY_CARE_PROVIDER_SITE_OTHER): Payer: PPO | Admitting: Neurology

## 2016-01-26 ENCOUNTER — Encounter: Payer: Self-pay | Admitting: Neurology

## 2016-01-26 VITALS — BP 126/70 | HR 66 | Resp 12 | Ht 63.0 in | Wt 128.5 lb

## 2016-01-26 DIAGNOSIS — W57XXXA Bitten or stung by nonvenomous insect and other nonvenomous arthropods, initial encounter: Secondary | ICD-10-CM

## 2016-01-26 DIAGNOSIS — G35 Multiple sclerosis: Secondary | ICD-10-CM | POA: Diagnosis not present

## 2016-01-26 DIAGNOSIS — T148 Other injury of unspecified body region: Secondary | ICD-10-CM | POA: Diagnosis not present

## 2016-01-26 DIAGNOSIS — R261 Paralytic gait: Secondary | ICD-10-CM

## 2016-01-26 DIAGNOSIS — Z79899 Other long term (current) drug therapy: Secondary | ICD-10-CM | POA: Diagnosis not present

## 2016-01-26 DIAGNOSIS — G501 Atypical facial pain: Secondary | ICD-10-CM | POA: Diagnosis not present
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
## 2016-01-28 ENCOUNTER — Telehealth: Payer: Self-pay | Admitting: *Deleted

## 2016-01-28 LAB — CBC WITH DIFFERENTIAL/PLATELET
BASOS ABS: 0 10*3/uL (ref 0.0–0.2)
Basos: 0 %
EOS (ABSOLUTE): 0.3 10*3/uL (ref 0.0–0.4)
EOS: 4 %
HEMATOCRIT: 40.3 % (ref 34.0–46.6)
Hemoglobin: 13 g/dL (ref 11.1–15.9)
Immature Grans (Abs): 0 10*3/uL (ref 0.0–0.1)
Immature Granulocytes: 0 %
LYMPHS ABS: 3.1 10*3/uL (ref 0.7–3.1)
Lymphs: 36 %
MCH: 30.5 pg (ref 26.6–33.0)
MCHC: 32.3 g/dL (ref 31.5–35.7)
MCV: 95 fL (ref 79–97)
MONOS ABS: 0.8 10*3/uL (ref 0.1–0.9)
Monocytes: 9 %
NEUTROS PCT: 51 %
Neutrophils Absolute: 4.4 10*3/uL (ref 1.4–7.0)
PLATELETS: 294 10*3/uL (ref 150–379)
RBC: 4.26 x10E6/uL (ref 3.77–5.28)
RDW: 14.5 % (ref 12.3–15.4)
WBC: 8.6 10*3/uL (ref 3.4–10.8)

## 2016-01-28 LAB — HEPATIC FUNCTION PANEL
ALK PHOS: 196 IU/L — AB (ref 39–117)
ALT: 86 IU/L — AB (ref 0–32)
AST: 48 IU/L — AB (ref 0–40)
Albumin: 4.6 g/dL (ref 3.5–5.5)
BILIRUBIN, DIRECT: 0.08 mg/dL (ref 0.00–0.40)
Bilirubin Total: 0.2 mg/dL (ref 0.0–1.2)
Total Protein: 6.9 g/dL (ref 6.0–8.5)

## 2016-01-28 LAB — LYME AB/WESTERN BLOT REFLEX

## 2016-01-28 NOTE — Telephone Encounter (Signed)
-----   Message from Britt Bottom, MD sent at 01/28/2016 12:02 PM EDT ----- (this may be duplicate) 1.   Lyme titer is normal. 2.    Some of the liver enzymes are elevated. This could be due to her recent infection or to the [place]. I would like her to reduce the Aubagio to every other day and we will recheck the hepatic function test in 5-6 weeks

## 2016-01-28 NOTE — Telephone Encounter (Signed)
I have spoken with Madsion this morning and per RAS, advised that her lyme titer is normal.  Also advised that lft's are slightly elevated--either b/c of recent infection, or Aubagio.  She should decrease Aubagio to qod for now.  Will repeat lft's in 5-6 weeks to see if they are back to normal.  She verbalized understanding of same.  I'll give her a reminder call when it is time to repeat labs/fim

## 2016-02-08 DIAGNOSIS — Z79899 Other long term (current) drug therapy: Secondary | ICD-10-CM | POA: Diagnosis not present

## 2016-02-08 DIAGNOSIS — E782 Mixed hyperlipidemia: Secondary | ICD-10-CM | POA: Diagnosis not present

## 2016-02-08 DIAGNOSIS — E119 Type 2 diabetes mellitus without complications: Secondary | ICD-10-CM | POA: Diagnosis not present

## 2016-02-08 DIAGNOSIS — R5383 Other fatigue: Secondary | ICD-10-CM | POA: Diagnosis not present

## 2016-02-08 DIAGNOSIS — R945 Abnormal results of liver function studies: Secondary | ICD-10-CM | POA: Diagnosis not present

## 2016-02-10 DIAGNOSIS — G35 Multiple sclerosis: Secondary | ICD-10-CM | POA: Diagnosis not present

## 2016-02-10 DIAGNOSIS — F4001 Agoraphobia with panic disorder: Secondary | ICD-10-CM | POA: Diagnosis not present

## 2016-02-10 DIAGNOSIS — F411 Generalized anxiety disorder: Secondary | ICD-10-CM | POA: Diagnosis not present

## 2016-02-10 DIAGNOSIS — G500 Trigeminal neuralgia: Secondary | ICD-10-CM | POA: Diagnosis not present

## 2016-02-10 DIAGNOSIS — Z6822 Body mass index (BMI) 22.0-22.9, adult: Secondary | ICD-10-CM | POA: Diagnosis not present

## 2016-02-10 DIAGNOSIS — R945 Abnormal results of liver function studies: Secondary | ICD-10-CM | POA: Diagnosis not present

## 2016-02-10 DIAGNOSIS — E782 Mixed hyperlipidemia: Secondary | ICD-10-CM | POA: Diagnosis not present

## 2016-03-01 ENCOUNTER — Telehealth: Payer: Self-pay | Admitting: *Deleted

## 2016-03-01 NOTE — Telephone Encounter (Signed)
-----   Message from Britt Bottom, MD sent at 01/28/2016 12:02 PM EDT ----- (this may be duplicate) 1.   Lyme titer is normal. 2.    Some of the liver enzymes are elevated. This could be due to her recent infection or to the [place]. I would like her to reduce the Aubagio to every other day and we will recheck the hepatic function test in 5-6 weeks

## 2016-03-01 NOTE — Telephone Encounter (Signed)
LMOM (identified vm) that it is time to repeat lft's.  I will put orders in EPIC and she can come in at her convenience during lab hrs. which are 8a-1145, and 1230-5p/fim

## 2016-03-09 ENCOUNTER — Other Ambulatory Visit (INDEPENDENT_AMBULATORY_CARE_PROVIDER_SITE_OTHER): Payer: Self-pay

## 2016-03-09 ENCOUNTER — Other Ambulatory Visit: Payer: Self-pay | Admitting: *Deleted

## 2016-03-09 DIAGNOSIS — Z0289 Encounter for other administrative examinations: Secondary | ICD-10-CM

## 2016-03-09 DIAGNOSIS — G35 Multiple sclerosis: Secondary | ICD-10-CM

## 2016-03-09 DIAGNOSIS — Z79899 Other long term (current) drug therapy: Secondary | ICD-10-CM

## 2016-03-10 ENCOUNTER — Telehealth: Payer: Self-pay | Admitting: *Deleted

## 2016-03-10 LAB — HEPATIC FUNCTION PANEL
ALBUMIN: 4.6 g/dL (ref 3.5–5.5)
ALK PHOS: 203 IU/L — AB (ref 39–117)
ALT: 30 IU/L (ref 0–32)
AST: 31 IU/L (ref 0–40)
BILIRUBIN TOTAL: 0.3 mg/dL (ref 0.0–1.2)
Bilirubin, Direct: 0.11 mg/dL (ref 0.00–0.40)
Total Protein: 7.2 g/dL (ref 6.0–8.5)

## 2016-03-10 NOTE — Telephone Encounter (Signed)
-----   Message from Britt Bottom, MD sent at 03/10/2016 11:33 AM EDT ----- Her lab work is okay.   The one liver test was little high but not enough to be worried about.

## 2016-03-10 NOTE — Telephone Encounter (Signed)
LMOM (identified vm) that per RAS, labs look ok.  One liver test is slightly high, but not enough to be worried about.  She does not need to call me back unless she has questions/fim

## 2016-05-16 DIAGNOSIS — R1031 Right lower quadrant pain: Secondary | ICD-10-CM | POA: Diagnosis not present

## 2016-05-16 DIAGNOSIS — L84 Corns and callosities: Secondary | ICD-10-CM | POA: Diagnosis not present

## 2016-05-16 DIAGNOSIS — R1011 Right upper quadrant pain: Secondary | ICD-10-CM | POA: Diagnosis not present

## 2016-05-16 DIAGNOSIS — Z6824 Body mass index (BMI) 24.0-24.9, adult: Secondary | ICD-10-CM | POA: Diagnosis not present

## 2016-05-23 DIAGNOSIS — R1031 Right lower quadrant pain: Secondary | ICD-10-CM | POA: Diagnosis not present

## 2016-05-23 DIAGNOSIS — Q510 Agenesis and aplasia of uterus: Secondary | ICD-10-CM | POA: Diagnosis not present

## 2016-05-23 DIAGNOSIS — R1011 Right upper quadrant pain: Secondary | ICD-10-CM | POA: Diagnosis not present

## 2016-05-23 DIAGNOSIS — Q600 Renal agenesis, unilateral: Secondary | ICD-10-CM | POA: Diagnosis not present

## 2016-05-23 DIAGNOSIS — R102 Pelvic and perineal pain: Secondary | ICD-10-CM | POA: Diagnosis not present

## 2016-06-07 DIAGNOSIS — R1904 Left lower quadrant abdominal swelling, mass and lump: Secondary | ICD-10-CM | POA: Diagnosis not present

## 2016-06-07 DIAGNOSIS — R1030 Lower abdominal pain, unspecified: Secondary | ICD-10-CM | POA: Diagnosis not present

## 2016-06-07 DIAGNOSIS — Z6823 Body mass index (BMI) 23.0-23.9, adult: Secondary | ICD-10-CM | POA: Diagnosis not present

## 2016-06-09 DIAGNOSIS — R1904 Left lower quadrant abdominal swelling, mass and lump: Secondary | ICD-10-CM | POA: Diagnosis not present

## 2016-06-09 DIAGNOSIS — R935 Abnormal findings on diagnostic imaging of other abdominal regions, including retroperitoneum: Secondary | ICD-10-CM | POA: Diagnosis not present

## 2016-06-09 DIAGNOSIS — I700 Atherosclerosis of aorta: Secondary | ICD-10-CM | POA: Diagnosis not present

## 2016-06-09 DIAGNOSIS — K6389 Other specified diseases of intestine: Secondary | ICD-10-CM | POA: Diagnosis not present

## 2016-06-09 DIAGNOSIS — R1030 Lower abdominal pain, unspecified: Secondary | ICD-10-CM | POA: Diagnosis not present

## 2016-06-09 DIAGNOSIS — M5137 Other intervertebral disc degeneration, lumbosacral region: Secondary | ICD-10-CM | POA: Diagnosis not present

## 2016-06-09 DIAGNOSIS — M5136 Other intervertebral disc degeneration, lumbar region: Secondary | ICD-10-CM | POA: Diagnosis not present

## 2016-06-14 ENCOUNTER — Telehealth: Payer: Self-pay | Admitting: Neurology

## 2016-06-14 MED ORDER — CYCLOBENZAPRINE HCL 5 MG PO TABS: 5.0000 mg | ORAL_TABLET | Freq: Three times a day (TID) | ORAL | 2 refills | Status: DC | PRN

## 2016-06-14 MED ORDER — GABAPENTIN 600 MG PO TABS: 600.0000 mg | ORAL_TABLET | Freq: Four times a day (QID) | ORAL | 11 refills | Status: DC

## 2016-06-14 MED ORDER — LAMOTRIGINE 200 MG PO TABS: ORAL_TABLET | ORAL | 5 refills | Status: DC

## 2016-06-14 NOTE — Telephone Encounter (Signed)
Pt called request refill for gabapentin (NEURONTIN) 600 MG tablet , lamoTRIgine (LAMICTAL) 200 MG tablet and cyclobenzaprine (FLEXERIL) 5 MG tablet sent to [facility]

## 2016-06-14 NOTE — Telephone Encounter (Signed)
Gabapentin, Flexeril and Lamictal rx's escribed to [facility] in [place] as requested/fim

## 2016-06-21 DIAGNOSIS — E7800 Pure hypercholesterolemia, unspecified: Secondary | ICD-10-CM | POA: Diagnosis not present

## 2016-06-21 DIAGNOSIS — E119 Type 2 diabetes mellitus without complications: Secondary | ICD-10-CM | POA: Diagnosis not present

## 2016-06-21 DIAGNOSIS — R945 Abnormal results of liver function studies: Secondary | ICD-10-CM | POA: Diagnosis not present

## 2016-06-21 DIAGNOSIS — E782 Mixed hyperlipidemia: Secondary | ICD-10-CM | POA: Diagnosis not present

## 2016-06-23 DIAGNOSIS — Z1389 Encounter for screening for other disorder: Secondary | ICD-10-CM | POA: Diagnosis not present

## 2016-06-23 DIAGNOSIS — E782 Mixed hyperlipidemia: Secondary | ICD-10-CM | POA: Diagnosis not present

## 2016-06-23 DIAGNOSIS — G35 Multiple sclerosis: Secondary | ICD-10-CM | POA: Diagnosis not present

## 2016-06-23 DIAGNOSIS — Z1231 Encounter for screening mammogram for malignant neoplasm of breast: Secondary | ICD-10-CM | POA: Diagnosis not present

## 2016-06-23 DIAGNOSIS — Z6824 Body mass index (BMI) 24.0-24.9, adult: Secondary | ICD-10-CM | POA: Diagnosis not present

## 2016-06-23 DIAGNOSIS — F324 Major depressive disorder, single episode, in partial remission: Secondary | ICD-10-CM | POA: Diagnosis not present

## 2016-06-23 DIAGNOSIS — F411 Generalized anxiety disorder: Secondary | ICD-10-CM | POA: Diagnosis not present

## 2016-06-23 DIAGNOSIS — G500 Trigeminal neuralgia: Secondary | ICD-10-CM | POA: Diagnosis not present

## 2016-06-23 DIAGNOSIS — F4001 Agoraphobia with panic disorder: Secondary | ICD-10-CM | POA: Diagnosis not present

## 2016-06-26 ENCOUNTER — Telehealth: Payer: Self-pay | Admitting: *Deleted

## 2016-06-26 NOTE — Telephone Encounter (Signed)
Aubagio PA completed and faxed to [facility] fax# [phone].  Pt. has tried Betaseron (2009-2015) in the past, stopped due to inj. site fatigue.  Has been stable on Aubagio since Feb. 2016/fim

## 2016-06-27 DIAGNOSIS — H04123 Dry eye syndrome of bilateral lacrimal glands: Secondary | ICD-10-CM | POA: Diagnosis not present

## 2016-07-13 NOTE — Telephone Encounter (Signed)
Called [facility] [phone]) - approval for Aubagio extended through 06/18/17 [place]:[id number].  Faxed updated information to [facility] at [phone].

## 2016-07-27 DIAGNOSIS — N83292 Other ovarian cyst, left side: Secondary | ICD-10-CM | POA: Diagnosis not present

## 2016-07-27 DIAGNOSIS — Z6824 Body mass index (BMI) 24.0-24.9, adult: Secondary | ICD-10-CM | POA: Diagnosis not present

## 2016-07-27 DIAGNOSIS — N949 Unspecified condition associated with female genital organs and menstrual cycle: Secondary | ICD-10-CM | POA: Diagnosis not present

## 2016-08-01 ENCOUNTER — Ambulatory Visit (INDEPENDENT_AMBULATORY_CARE_PROVIDER_SITE_OTHER): Payer: PPO | Admitting: Neurology

## 2016-08-01 ENCOUNTER — Encounter: Payer: Self-pay | Admitting: Neurology

## 2016-08-01 VITALS — BP 114/68 | HR 76 | Resp 16 | Ht 63.0 in | Wt 141.0 lb

## 2016-08-01 DIAGNOSIS — G501 Atypical facial pain: Secondary | ICD-10-CM

## 2016-08-01 DIAGNOSIS — Z79899 Other long term (current) drug therapy: Secondary | ICD-10-CM | POA: Diagnosis not present

## 2016-08-01 DIAGNOSIS — G35 Multiple sclerosis: Secondary | ICD-10-CM

## 2016-08-01 DIAGNOSIS — R5383 Other fatigue: Secondary | ICD-10-CM

## 2016-08-01 DIAGNOSIS — F418 Other specified anxiety disorders: Secondary | ICD-10-CM | POA: Diagnosis not present

## 2016-08-01 DIAGNOSIS — R261 Paralytic gait: Secondary | ICD-10-CM

## 2016-08-01 DIAGNOSIS — R350 Frequency of micturition: Secondary | ICD-10-CM

## 2016-08-01 MED ORDER — CYCLOBENZAPRINE HCL 5 MG PO TABS: 5.0000 mg | ORAL_TABLET | Freq: Three times a day (TID) | ORAL | 11 refills | Status: DC | PRN

## 2016-08-01 MED ORDER — BACLOFEN 10 MG PO TABS: 10.0000 mg | ORAL_TABLET | Freq: Three times a day (TID) | ORAL | 11 refills | Status: DC

## 2016-08-01 MED ORDER — LAMOTRIGINE 200 MG PO TABS: ORAL_TABLET | ORAL | 11 refills | Status: DC
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
## 2016-08-02 LAB — HEPATIC FUNCTION PANEL
ALT: 34 IU/L — ABNORMAL HIGH (ref 0–32)
AST: 29 IU/L (ref 0–40)
Albumin: 4.3 g/dL (ref 3.5–5.5)
Alkaline Phosphatase: 200 IU/L — ABNORMAL HIGH (ref 39–117)
BILIRUBIN TOTAL: 0.3 mg/dL (ref 0.0–1.2)
Bilirubin, Direct: 0.1 mg/dL (ref 0.00–0.40)
Total Protein: 6.3 g/dL (ref 6.0–8.5)

## 2016-08-04 ENCOUNTER — Telehealth: Payer: Self-pay | Admitting: *Deleted

## 2016-08-04 NOTE — Telephone Encounter (Signed)
-----   Message from Britt Bottom, MD sent at 08/02/2016  4:03 PM EST ----- Liver test ok (one value mildly high is ok)

## 2016-08-04 NOTE — Telephone Encounter (Signed)
I have spoken with pt. this morning and per RAS, explained labs done in our office looked ok--one lft value slightly high, but ok.  She verbalized understanding of same/fim

## 2016-08-11 DIAGNOSIS — E785 Hyperlipidemia, unspecified: Secondary | ICD-10-CM | POA: Diagnosis not present

## 2016-08-11 DIAGNOSIS — Z7951 Long term (current) use of inhaled steroids: Secondary | ICD-10-CM | POA: Diagnosis not present

## 2016-08-11 DIAGNOSIS — Z886 Allergy status to analgesic agent status: Secondary | ICD-10-CM | POA: Diagnosis not present

## 2016-08-11 DIAGNOSIS — R102 Pelvic and perineal pain: Secondary | ICD-10-CM | POA: Diagnosis not present

## 2016-08-11 DIAGNOSIS — K589 Irritable bowel syndrome without diarrhea: Secondary | ICD-10-CM | POA: Diagnosis not present

## 2016-08-11 DIAGNOSIS — D62 Acute posthemorrhagic anemia: Secondary | ICD-10-CM | POA: Diagnosis not present

## 2016-08-11 DIAGNOSIS — Z800 Family history of malignant neoplasm of digestive organs: Secondary | ICD-10-CM | POA: Diagnosis not present

## 2016-08-11 DIAGNOSIS — F17210 Nicotine dependence, cigarettes, uncomplicated: Secondary | ICD-10-CM | POA: Diagnosis not present

## 2016-08-11 DIAGNOSIS — Z882 Allergy status to sulfonamides status: Secondary | ICD-10-CM | POA: Diagnosis not present

## 2016-08-11 DIAGNOSIS — Z79899 Other long term (current) drug therapy: Secondary | ICD-10-CM | POA: Diagnosis not present

## 2016-08-11 DIAGNOSIS — N839 Noninflammatory disorder of ovary, fallopian tube and broad ligament, unspecified: Secondary | ICD-10-CM | POA: Diagnosis not present

## 2016-08-11 DIAGNOSIS — N838 Other noninflammatory disorders of ovary, fallopian tube and broad ligament: Secondary | ICD-10-CM | POA: Diagnosis not present

## 2016-08-11 DIAGNOSIS — Z880 Allergy status to penicillin: Secondary | ICD-10-CM | POA: Diagnosis not present

## 2016-08-11 DIAGNOSIS — D271 Benign neoplasm of left ovary: Secondary | ICD-10-CM | POA: Diagnosis not present

## 2016-08-11 DIAGNOSIS — G500 Trigeminal neuralgia: Secondary | ICD-10-CM | POA: Diagnosis not present

## 2016-08-11 DIAGNOSIS — F329 Major depressive disorder, single episode, unspecified: Secondary | ICD-10-CM | POA: Diagnosis not present

## 2016-08-11 DIAGNOSIS — Z833 Family history of diabetes mellitus: Secondary | ICD-10-CM | POA: Diagnosis not present

## 2016-08-11 DIAGNOSIS — K219 Gastro-esophageal reflux disease without esophagitis: Secondary | ICD-10-CM | POA: Diagnosis not present

## 2016-08-11 DIAGNOSIS — G35 Multiple sclerosis: Secondary | ICD-10-CM | POA: Diagnosis not present

## 2016-08-11 DIAGNOSIS — Q510 Agenesis and aplasia of uterus: Secondary | ICD-10-CM | POA: Diagnosis not present

## 2016-08-11 DIAGNOSIS — Z8249 Family history of ischemic heart disease and other diseases of the circulatory system: Secondary | ICD-10-CM | POA: Diagnosis not present

## 2016-10-05 DIAGNOSIS — M79642 Pain in left hand: Secondary | ICD-10-CM | POA: Diagnosis not present

## 2016-10-05 DIAGNOSIS — R1011 Right upper quadrant pain: Secondary | ICD-10-CM | POA: Diagnosis not present

## 2016-10-05 DIAGNOSIS — Z6825 Body mass index (BMI) 25.0-25.9, adult: Secondary | ICD-10-CM | POA: Diagnosis not present

## 2016-10-05 DIAGNOSIS — M545 Low back pain: Secondary | ICD-10-CM | POA: Diagnosis not present

## 2016-10-05 DIAGNOSIS — M79641 Pain in right hand: Secondary | ICD-10-CM | POA: Diagnosis not present

## 2016-10-11 ENCOUNTER — Encounter: Payer: Self-pay | Admitting: Internal Medicine

## 2016-10-23 DIAGNOSIS — R1011 Right upper quadrant pain: Secondary | ICD-10-CM | POA: Diagnosis not present

## 2016-11-08 ENCOUNTER — Ambulatory Visit (INDEPENDENT_AMBULATORY_CARE_PROVIDER_SITE_OTHER): Payer: PPO | Admitting: Gastroenterology

## 2016-11-08 ENCOUNTER — Other Ambulatory Visit: Payer: Self-pay

## 2016-11-08 ENCOUNTER — Encounter: Payer: Self-pay | Admitting: Gastroenterology

## 2016-11-08 VITALS — BP 136/78 | HR 95 | Temp 97.3°F | Ht 63.0 in | Wt 136.8 lb

## 2016-11-08 DIAGNOSIS — D126 Benign neoplasm of colon, unspecified: Secondary | ICD-10-CM | POA: Diagnosis not present

## 2016-11-08 DIAGNOSIS — Z86010 Personal history of colonic polyps: Secondary | ICD-10-CM

## 2016-11-08 DIAGNOSIS — R109 Unspecified abdominal pain: Secondary | ICD-10-CM | POA: Diagnosis not present

## 2016-11-08 MED ORDER — PEG-KCL-NACL-NASULF-NA ASC-C 100 G PO SOLR: 1.0000 | ORAL | 0 refills | Status: DC

## 2016-11-08 NOTE — Progress Notes (Addendum)
REVIEWED-NO ADDITIONAL RECOMMENDATIONS.  Primary Care Physician:  Manon Hilding, MD Primary Gastroenterologist:  Dr. Oneida Alar   Chief Complaint  Patient presents with  . Colonoscopy    last tcs 3 yrs ago, due q88yr  . Abdominal Pain    HPI:   Megan REMUSis a 51y.o. female presenting today at the request of her PCP secondary to history of multiple colon polyps and need for surveillance now. Last done in 2015 by Dr. BBritta Mccreedywith a total of 7 polyps, one being 1 cm in size, with path noting tubular adenoma and serrated adenoma.   Had a hysterectomy a few months ago due to a large ovarian cyst. Pain in RLQ, RUQ. Symptoms for about 4-5 months. Pain present prior to hysterectomy. Intermittent. No problems with eating or drinking. Has chronic nausea since starting treatment for MS. Mom passed away about a year ago. She was the sole caregiver of her.   Has alternating bowel habits going back and forth between constipation and diarrhea. No rectal bleeding. No unexplained weight loss. Has chronic low appetite.   Was told her H.pylori serology was negative, and I received documentation supporting this to be scanned.   Past Medical History:  Diagnosis Date  . Anxiety   . Depression   . GERD (gastroesophageal reflux disease)   . Hypercholesterolemia   . Memory loss   . Multiple sclerosis ([place]   . Trigeminal neuralgia   . Vision abnormalities     Past Surgical History:  Procedure Laterality Date  . ABDOMINAL HYSTERECTOMY    . COLONOSCOPY  2015   Dr. BBritta Mccreedy multiple polyps (7 in total), one polyp in sigmoid was 1 cm in size. tubular adenomas and serrated adenomas    Current Outpatient Prescriptions  Medication Sig Dispense Refill  . ALPRAZolam (XANAX) 0.5 MG tablet Take 0.5 mg by mouth daily as needed for anxiety.    . baclofen (LIORESAL) 10 MG tablet Take 1 tablet (10 mg total) by mouth 3 (three) times daily. 90 each 11  . busPIRone (BUSPAR) 10 MG tablet Take 10 mg by mouth 2  (two) times daily.    . cetirizine (ZYRTEC) 10 MG tablet Take 10 mg by mouth daily.    . cyclobenzaprine (FLEXERIL) 5 MG tablet Take 1 tablet (5 mg total) by mouth every 8 (eight) hours as needed for muscle spasms. 90 tablet 11  . FLUoxetine (PROZAC) 20 MG capsule Take 60 mg by mouth daily.     . fluticasone (FLONASE) 50 MCG/ACT nasal spray Place into both nostrils 2 (two) times daily.    .Marland Kitchengabapentin (NEURONTIN) 600 MG tablet Take 1 tablet (600 mg total) by mouth 4 (four) times daily. 120 tablet 11  . lamoTRIgine (LAMICTAL) 200 MG tablet Take 1 tablet three times daily (Patient taking differently: Take 200 mg by mouth 2 (two) times daily. Take 1 tablet three times daily) 90 tablet 11  . omeprazole (PRILOSEC) 20 MG capsule Take 20 mg by mouth daily.    . rosuvastatin (CRESTOR) 10 MG tablet Take 10 mg by mouth daily.    . Teriflunomide 14 MG TABS Take 14 mg by mouth daily. 28 tablet 12  . peg 3350 powder (MOVIPREP) 100 g SOLR Take 1 kit (200 g total) by mouth as directed. 1 kit 0   No current facility-administered medications for this visit.     Allergies as of 11/08/2016 - Review Complete 11/08/2016  Allergen Reaction Noted  . Codeine Other (See Comments) 08/25/2014  .  Sulfa antibiotics Other (See Comments) 08/25/2014  . Penicillins Rash 08/25/2014    Family History  Problem Relation Age of Onset  . Stroke Mother   . Parkinsonism Mother   . Colon cancer Mother 72  . Alzheimer's disease Father     Social History   Social History  . Marital status: Married    Spouse name: N/A  . Number of children: N/A  . Years of education: N/A   Occupational History  . Not on file.   Social History Main Topics  . Smoking status: Current Every Day Smoker    Packs/day: 0.50  . Smokeless tobacco: Never Used  . Alcohol use 1.2 oz/week    1 Glasses of wine, 1 Shots of liquor per week     Comment: occasionally  . Drug use: No  . Sexual activity: Not on file   Other Topics Concern  . Not  on file   Social History Narrative  . No narrative on file    Review of Systems: Negative unless mentioned in HPI.    Physical Exam: BP 136/78   Pulse 95   Temp 97.3 F (36.3 C) (Oral)   Ht 5' 3"  (1.6 m)   Wt 136 lb 12.8 oz (62.1 kg)   BMI 24.23 kg/m  General:   Alert and oriented. Pleasant and cooperative. Well-nourished and well-developed.  Head:  Normocephalic and atraumatic. Eyes:  Without icterus, sclera clear and conjunctiva pink.  Ears:  Normal auditory acuity. Nose:  No deformity, discharge,  or lesions. Mouth:  No deformity or lesions, oral mucosa pink.  Lungs:  Clear to auscultation bilaterally. No wheezes, rales, or rhonchi. No distress.  Heart:  S1, S2 present without murmurs appreciated.  Abdomen:  +BS, soft, mild TTP RLQ and non-distended. No HSM noted. No guarding or rebound. No masses appreciated.  Rectal:  Deferred  Msk:  Symmetrical without gross deformities. Normal posture. Extremities:  Without  edema. Neurologic:  Alert and  oriented x4 Psych:  Alert and cooperative. Normal mood and affect.  Lab Results  Component Value Date   ALT 34 (H) 08/01/2016   AST 29 08/01/2016   ALKPHOS 200 (H) 08/01/2016   BILITOT 0.3 08/01/2016

## 2016-11-08 NOTE — Patient Instructions (Signed)
We have scheduled you for a colonoscopy with Dr. Oneida Alar in the near future.  I will need to get all of the prior evaluation regarding your abdominal pain before coming up with a plan.  Further recommendations to follow

## 2016-11-09 NOTE — Assessment & Plan Note (Addendum)
For 4-5 months, present prior to hysterectomy. No association with eating, drinking. Will need to retrieve any imaging and labs from outside facility. I note she has a history of mildly elevated transaminases and elevated alk phos. Will need to update LFTs if none recent. Will need US abdomen if no recent on file. No acute findings at time of visit.   Addendum: received LFTs from Dec 2017 with alk phos 236, AST 66, ALT 50. Most recent LFTs on file from [name] 2018 with Alk Phos 200, ALT 34. Will order GGT, AMA, ANA, ASMA. Received HIDA scan dated April 2018 with EF 96%. Unclear if she has had a CT or ultrasound. Will request from [facility] and in interim obtain labs.   Addendum Nov 20, 2016: received CT from [facility] dated 05/2016. Findings of fat-containing mass in LLQ, consistent with ovarian dermoid. Absent right kidney, presumably congenital. Aortic atherosclerosis. Chronic degenerative disc bulges at L4-L5 and L5-S1. Liver normal. Gallbladder and pancreas normal.  

## 2016-11-09 NOTE — Assessment & Plan Note (Addendum)
51 year old female with history of multiple colon polyps in 2015 by Dr. Britta Mccreedy, tubular adenoma and serrated adenoma, with one polyp 1 cm in size. No overt GI bleeding. Will proceed with surveillance now. As of note, she has a family history of colon cancer with her mother diagnosed after age 74.   Proceed with colonoscopy with Dr. Oneida Alar in the near future. The risks, benefits, and alternatives have been discussed in detail with the patient. They state understanding and desire to proceed.  PROPOFOL due to polypharmacy

## 2016-11-10 NOTE — Progress Notes (Signed)
CC'D TO PCP °

## 2016-11-14 ENCOUNTER — Telehealth: Payer: Self-pay | Admitting: Gastroenterology

## 2016-11-14 ENCOUNTER — Other Ambulatory Visit: Payer: Self-pay

## 2016-11-14 DIAGNOSIS — R7989 Other specified abnormal findings of blood chemistry: Secondary | ICD-10-CM

## 2016-11-14 DIAGNOSIS — R945 Abnormal results of liver function studies: Principal | ICD-10-CM

## 2016-11-14 NOTE — Telephone Encounter (Signed)
Pt would like lab orders mailed to her so she can go to [place]. Also she said she had a CT done within the last 6 months  At [facility]. Forwarding to Manuela Schwartz to request. Lab orders mailed.

## 2016-11-14 NOTE — Telephone Encounter (Signed)
Received LFTs from Dec 2017 with alk phos 236, AST 66, ALT 50. Most recent LFTs on file from [name] 2018 with Alk Phos 200, ALT 34.   Please order GGT, AMA, ANA, ASMA. Received HIDA scan dated April 2018 with EF 96%. Unclear if she has had a CT or ultrasound. Need an CT imaging from [place]. If she has not had any, we will need to proceed with CT abd/pelvis due to right-sided abdominal pain.

## 2016-11-14 NOTE — Telephone Encounter (Signed)
Requested CT from [facility]

## 2016-11-16 DIAGNOSIS — M791 Myalgia: Secondary | ICD-10-CM | POA: Diagnosis not present

## 2016-11-16 DIAGNOSIS — R197 Diarrhea, unspecified: Secondary | ICD-10-CM | POA: Diagnosis not present

## 2016-11-16 DIAGNOSIS — M545 Low back pain: Secondary | ICD-10-CM | POA: Diagnosis not present

## 2016-11-16 DIAGNOSIS — G35 Multiple sclerosis: Secondary | ICD-10-CM | POA: Diagnosis not present

## 2016-11-16 DIAGNOSIS — R05 Cough: Secondary | ICD-10-CM | POA: Diagnosis not present

## 2016-11-16 DIAGNOSIS — Z6823 Body mass index (BMI) 23.0-23.9, adult: Secondary | ICD-10-CM | POA: Diagnosis not present

## 2016-11-16 DIAGNOSIS — R51 Headache: Secondary | ICD-10-CM | POA: Diagnosis not present

## 2016-11-16 DIAGNOSIS — R300 Dysuria: Secondary | ICD-10-CM | POA: Diagnosis not present

## 2016-11-20 ENCOUNTER — Telehealth: Payer: Self-pay

## 2016-11-20 NOTE — Telephone Encounter (Signed)
Megan Chan, can we call the patient and verify the correct spelling of her name, please
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
## 2016-11-20 NOTE — Telephone Encounter (Signed)
Pt called because she receive a bill for our office appointment and she has questions because it say hat she does not have insurance and she does. She would like to talk to you about it.

## 2016-11-30 ENCOUNTER — Telehealth: Payer: Self-pay | Admitting: *Deleted

## 2016-11-30 MED ORDER — TERIFLUNOMIDE 14 MG PO TABS: 14.0000 mg | ORAL_TABLET | Freq: Every day | ORAL | 3 refills | Status: DC

## 2016-11-30 NOTE — Telephone Encounter (Signed)
Aubagio escribed to [place] per faxed request/fim

## 2016-12-04 NOTE — Patient Instructions (Addendum)
Megan Chan  12/04/2016     @PREFPERIOPPHARMACY @   Your procedure is scheduled on 12/12/2016.  Report to [facility] at 9:30 A.M.  Call this number if you have problems the morning of surgery:  [phone]   Remember:  Do not eat food or drink liquids after midnight.  Take these medicines the morning of surgery with A SIP OF WATER : Xanax, Prilosec, Lamictal, Neurontin, Prozac, Flexeril and Buspar and Flonase  Do not wear jewelry, make-up or nail polish.  Do not wear lotions, powders, or perfumes, or deoderant.  Do not shave 48 hours prior to surgery.  Men may shave face and neck.  Do not bring valuables to the hospital.  [facility] is not responsible for any belongings or valuables.  Contacts, dentures or bridgework may not be worn into surgery.  Leave your suitcase in the car.  After surgery it may be brought to your room.  For patients admitted to the hospital, discharge time will be determined by your treatment team.  Patients discharged the day of surgery will not be allowed to drive home.   Name and phone number of your driver:   family Special instructions:  n/a  Please read over the following fact sheets that you were given. Care and Recovery After Surgery       Colonoscopy, Adult A colonoscopy is an exam to look at the entire large intestine. During the exam, a lubricated, bendable tube is inserted into the anus and then passed into the rectum, colon, and other parts of the large intestine. A colonoscopy is often done as a part of normal colorectal screening or in response to certain symptoms, such as anemia, persistent diarrhea, abdominal pain, and blood in the stool. The exam can help screen for and diagnose medical problems, including:  Tumors.  Polyps.  Inflammation.  Areas of bleeding. Tell a health care provider about:  Any allergies you have.  All medicines you are taking, including vitamins, herbs, eye drops, creams, and over-the-counter  medicines.  Any problems you or family members have had with anesthetic medicines.  Any blood disorders you have.  Any surgeries you have had.  Any medical conditions you have.  Any problems you have had passing stool. What are the risks? Generally, this is a safe procedure. However, problems may occur, including:  Bleeding.  A tear in the intestine.  A reaction to medicines given during the exam.  Infection (rare). What happens before the procedure? Eating and drinking restrictions  Follow instructions from your health care provider about eating and drinking, which may include:  A few days before the procedure - follow a low-fiber diet. Avoid nuts, seeds, dried fruit, raw fruits, and vegetables.  1-3 days before the procedure - follow a clear liquid diet. Drink only clear liquids, such as clear broth or bouillon, black coffee or tea, clear juice, clear soft drinks or sports drinks, gelatin dessert, and popsicles. Avoid any liquids that contain red or purple dye.  On the day of the procedure - do not eat or drink anything during the 2 hours before the procedure, or within the time period that your health care provider recommends. Bowel prep  If you were prescribed an oral bowel prep to clean out your colon:  Take it as told by your health care provider. Starting the day before your procedure, you will need to drink a large amount of medicated liquid. The liquid will cause you to have multiple loose stools until your stool  is almost clear or light green.  If your skin or anus gets irritated from diarrhea, you may use these to relieve the irritation:  Medicated wipes, such as adult wet wipes with aloe and vitamin E.  A skin soothing-product like petroleum jelly.  If you vomit while drinking the bowel prep, take a break for up to 60 minutes and then begin the bowel prep again. If vomiting continues and you cannot take the bowel prep without vomiting, call your health care  provider. General instructions   Ask your health care provider about changing or stopping your regular medicines. This is especially important if you are taking diabetes medicines or blood thinners.  Plan to have someone take you home from the hospital or clinic. What happens during the procedure?  An IV tube may be inserted into one of your veins.  You will be given medicine to help you relax (sedative).  To reduce your risk of infection:  Your health care team will wash or sanitize their hands.  Your anal area will be washed with soap.  You will be asked to lie on your side with your knees bent.  Your health care provider will lubricate a long, thin, flexible tube. The tube will have a camera and a light on the end.  The tube will be inserted into your anus.  The tube will be gently eased through your rectum and colon.  Air will be delivered into your colon to keep it open. You may feel some pressure or cramping.  The camera will be used to take images during the procedure.  A small tissue sample may be removed from your body to be examined under a microscope (biopsy). If any potential problems are found, the tissue will be sent to a lab for testing.  If small polyps are found, your health care provider may remove them and have them checked for cancer cells.  The tube that was inserted into your anus will be slowly removed. The procedure may vary among health care providers and hospitals. What happens after the procedure?  Your blood pressure, heart rate, breathing rate, and blood oxygen level will be monitored until the medicines you were given have worn off.  Do not drive for 24 hours after the exam.  You may have a small amount of blood in your stool.  You may pass gas and have mild abdominal cramping or bloating due to the air that was used to inflate your colon during the exam.  It is up to you to get the results of your procedure. Ask your health care provider,  or the department performing the procedure, when your results will be ready. This information is not intended to replace advice given to you by your health care provider. Make sure you discuss any questions you have with your health care provider. Document Released: 06/30/2000 Document Revised: 05/03/2016 Document Reviewed: 09/14/2015 Elsevier Interactive Patient Education  2017 [employer].

## 2016-12-06 ENCOUNTER — Other Ambulatory Visit: Payer: Self-pay

## 2016-12-06 ENCOUNTER — Encounter (HOSPITAL_COMMUNITY)
Admission: RE | Admit: 2016-12-06 | Discharge: 2016-12-06 | Disposition: A | Discharge status: Home / Self Care | Payer: PPO | Source: Ambulatory Visit | Attending: Gastroenterology | Admitting: Gastroenterology

## 2016-12-06 ENCOUNTER — Encounter (HOSPITAL_COMMUNITY): Payer: Self-pay

## 2016-12-06 DIAGNOSIS — R7989 Other specified abnormal findings of blood chemistry: Secondary | ICD-10-CM | POA: Insufficient documentation

## 2016-12-06 LAB — GAMMA GT: GGT: 257 U/L — AB (ref 7–50)

## 2016-12-07 LAB — MITOCHONDRIAL ANTIBODIES: MITOCHONDRIAL M2 AB, IGG: 4.2 U (ref 0.0–20.0)

## 2016-12-07 LAB — ANTI-SMOOTH MUSCLE ANTIBODY, IGG: F-Actin IgG: 6 Units (ref 0–19)

## 2016-12-07 LAB — ANA W/REFLEX IF POSITIVE: ANA: NEGATIVE

## 2016-12-12 ENCOUNTER — Ambulatory Visit (HOSPITAL_COMMUNITY)
Admission: RE | Admit: 2016-12-12 | Discharge: 2016-12-12 | Disposition: A | Discharge status: Home / Self Care | Payer: PPO | Source: Ambulatory Visit | Attending: Gastroenterology | Admitting: Gastroenterology

## 2016-12-12 ENCOUNTER — Ambulatory Visit (HOSPITAL_COMMUNITY): Payer: PPO | Admitting: Anesthesiology

## 2016-12-12 ENCOUNTER — Encounter (HOSPITAL_COMMUNITY)
Admission: RE | Disposition: A | Discharge status: Home / Self Care | Payer: Self-pay | Source: Ambulatory Visit | Attending: Gastroenterology

## 2016-12-12 ENCOUNTER — Encounter (HOSPITAL_COMMUNITY): Payer: Self-pay | Admitting: *Deleted

## 2016-12-12 DIAGNOSIS — F419 Anxiety disorder, unspecified: Secondary | ICD-10-CM | POA: Diagnosis not present

## 2016-12-12 DIAGNOSIS — E7800 Pure hypercholesterolemia, unspecified: Secondary | ICD-10-CM | POA: Insufficient documentation

## 2016-12-12 DIAGNOSIS — F329 Major depressive disorder, single episode, unspecified: Secondary | ICD-10-CM | POA: Diagnosis not present

## 2016-12-12 DIAGNOSIS — Z880 Allergy status to penicillin: Secondary | ICD-10-CM | POA: Diagnosis not present

## 2016-12-12 DIAGNOSIS — F17210 Nicotine dependence, cigarettes, uncomplicated: Secondary | ICD-10-CM | POA: Insufficient documentation

## 2016-12-12 DIAGNOSIS — Z7952 Long term (current) use of systemic steroids: Secondary | ICD-10-CM | POA: Diagnosis not present

## 2016-12-12 DIAGNOSIS — G35 Multiple sclerosis: Secondary | ICD-10-CM | POA: Diagnosis not present

## 2016-12-12 DIAGNOSIS — G500 Trigeminal neuralgia: Secondary | ICD-10-CM | POA: Insufficient documentation

## 2016-12-12 DIAGNOSIS — Z1211 Encounter for screening for malignant neoplasm of colon: Secondary | ICD-10-CM | POA: Insufficient documentation

## 2016-12-12 DIAGNOSIS — Z800 Family history of malignant neoplasm of digestive organs: Secondary | ICD-10-CM | POA: Diagnosis not present

## 2016-12-12 DIAGNOSIS — Z86010 Personal history of colonic polyps: Secondary | ICD-10-CM | POA: Diagnosis not present

## 2016-12-12 DIAGNOSIS — K219 Gastro-esophageal reflux disease without esophagitis: Secondary | ICD-10-CM | POA: Diagnosis not present

## 2016-12-12 DIAGNOSIS — Z79899 Other long term (current) drug therapy: Secondary | ICD-10-CM | POA: Insufficient documentation

## 2016-12-12 DIAGNOSIS — K648 Other hemorrhoids: Secondary | ICD-10-CM | POA: Diagnosis not present

## 2016-12-12 DIAGNOSIS — Z882 Allergy status to sulfonamides status: Secondary | ICD-10-CM | POA: Diagnosis not present

## 2016-12-12 HISTORY — PX: COLONOSCOPY WITH PROPOFOL: SHX5780

## 2016-12-12 SURGERY — COLONOSCOPY WITH PROPOFOL
Anesthesia: Monitor Anesthesia Care

## 2016-12-12 MED ORDER — LACTATED RINGERS IV SOLN
INTRAVENOUS | Status: DC
  Administered 2016-12-12: 10:00:00 via INTRAVENOUS

## 2016-12-12 MED ORDER — FENTANYL CITRATE (PF) 100 MCG/2ML IJ SOLN
INTRAMUSCULAR | Status: AC
  Filled 2016-12-12: qty 2

## 2016-12-12 MED ORDER — CHLORHEXIDINE GLUCONATE CLOTH 2 % EX PADS
6.0000 | MEDICATED_PAD | Freq: Once | CUTANEOUS | Status: DC
Start: 2016-12-12 — End: 2016-12-12

## 2016-12-12 MED ORDER — MIDAZOLAM HCL 2 MG/2ML IJ SOLN
1.0000 mg | INTRAMUSCULAR | Status: AC
  Administered 2016-12-12: 1 mg via INTRAVENOUS

## 2016-12-12 MED ORDER — PROPOFOL 10 MG/ML IV BOLUS
INTRAVENOUS | Status: AC
  Filled 2016-12-12: qty 20

## 2016-12-12 MED ORDER — PROPOFOL 500 MG/50ML IV EMUL
INTRAVENOUS | Status: DC | PRN
  Administered 2016-12-12: 11:00:00 via INTRAVENOUS
  Administered 2016-12-12: 150 ug/kg/min via INTRAVENOUS

## 2016-12-12 MED ORDER — MIDAZOLAM HCL 2 MG/2ML IJ SOLN
INTRAMUSCULAR | Status: AC
  Filled 2016-12-12: qty 2

## 2016-12-12 MED ORDER — CHLORHEXIDINE GLUCONATE CLOTH 2 % EX PADS: 6.0000 | MEDICATED_PAD | Freq: Once | CUTANEOUS | Status: DC

## 2016-12-12 MED ORDER — FENTANYL CITRATE (PF) 100 MCG/2ML IJ SOLN
25.0000 ug | Freq: Once | INTRAMUSCULAR | Status: AC
  Administered 2016-12-12: 25 ug via INTRAVENOUS

## 2016-12-12 NOTE — Anesthesia Procedure Notes (Signed)
Procedure Name: MAC Date/Time: 12/12/2016 11:09 AM Performed by: Andree Elk, Kessa Fairbairn A Pre-anesthesia Checklist: Patient identified, Emergency Drugs available, Suction available, Patient being monitored and Timeout performed Oxygen Delivery Method: Simple face mask

## 2016-12-12 NOTE — Discharge Instructions (Signed)
YOU DID NOT HAVE ANY POLYPS. You have MODERATE internal AND LARGE EXTERNAL hemorrhoids.   TO REDUCE RISK FOR COLON CANCER:       1. DRINK WATER TO KEEP YOUR URINE LIGHT YELLOW.      2. FOLLOW A HIGH FIBER DIET. AVOID ITEMS THAT CAUSE BLOATING & GAS. SEE INFO BELOW.      3. STOP SMOKING  Next colonoscopy in 5 years. YOUR SISTERS, BROTHERS, CHILDREN, AND PARENTS NEED TO HAVE A COLONOSCOPY STARTING AT THE AGE OF 40.     Colonoscopy Care After Read the instructions outlined below and refer to this sheet in the next week. These discharge instructions provide you with general information on caring for yourself after you leave the hospital. While your treatment has been planned according to the most current medical practices available, unavoidable complications occasionally occur. If you have any problems or questions after discharge, call DR. Maureen Duesing, [phone].  ACTIVITY  You may resume your regular activity, but move at a slower pace for the next 24 hours.   Take frequent rest periods for the next 24 hours.   Walking will help get rid of the air and reduce the bloated feeling in your belly (abdomen).   No driving for 24 hours (because of the medicine (anesthesia) used during the test).   You may shower.   Do not sign any important legal documents or operate any machinery for 24 hours (because of the anesthesia used during the test).    NUTRITION  Drink plenty of fluids.   You may resume your normal diet as instructed by your doctor.   Begin with a light meal and progress to your normal diet. Heavy or fried foods are harder to digest and may make you feel sick to your stomach (nauseated).   Avoid alcoholic beverages for 24 hours or as instructed.    MEDICATIONS  You may resume your normal medications.   WHAT YOU CAN EXPECT TODAY  Some feelings of bloating in the abdomen.   Passage of more gas than usual.   Spotting of blood in your stool or on the toilet paper  .    IF YOU HAD POLYPS REMOVED DURING THE COLONOSCOPY:  Eat a soft diet IF YOU HAVE NAUSEA, BLOATING, ABDOMINAL PAIN, OR VOMITING.    FINDING OUT THE RESULTS OF YOUR TEST Not all test results are available during your visit. DR. Oneida Alar WILL CALL YOU WITHIN 14 DAYS OF YOUR PROCEDUE WITH YOUR RESULTS. Do not assume everything is normal if you have not heard from DR. Aaron Boeh, CALL HER OFFICE AT [phone].  SEEK IMMEDIATE MEDICAL ATTENTION AND CALL THE OFFICE: [phone] IF:  You have more than a spotting of blood in your stool.   Your belly is swollen (abdominal distention).   You are nauseated or vomiting.   You have a temperature over 101F.   You have abdominal pain or discomfort that is severe or gets worse throughout the day.   High-Fiber Diet A high-fiber diet changes your normal diet to include more whole grains, legumes, fruits, and vegetables. Changes in the diet involve replacing refined carbohydrates with unrefined foods. The calorie level of the diet is essentially unchanged. The Dietary Reference Intake (recommended amount) for adult males is 38 grams per day. For adult females, it is 25 grams per day. Pregnant and lactating women should consume 28 grams of fiber per day. Fiber is the intact part of a plant that is not broken down during digestion. Functional fiber is fiber  that has been isolated from the plant to provide a beneficial effect in the body. PURPOSE  Increase stool bulk.   Ease and regulate bowel movements.   Lower cholesterol.   REDUCE RISK OF COLON CANCER  INDICATIONS THAT YOU NEED MORE FIBER  Constipation and hemorrhoids.   Uncomplicated diverticulosis (intestine condition) and irritable bowel syndrome.   Weight management.   As a protective measure against hardening of the arteries (atherosclerosis), diabetes, and cancer.   GUIDELINES FOR INCREASING FIBER IN THE DIET  Start adding fiber to the diet slowly. A gradual increase of about 5 more  grams (2 slices of whole-wheat bread, 2 servings of most fruits or vegetables, or 1 bowl of high-fiber cereal) per day is best. Too rapid an increase in fiber may result in constipation, flatulence, and bloating.   Drink enough water and fluids to keep your urine clear or pale yellow. Water, juice, or caffeine-free drinks are recommended. Not drinking enough fluid may cause constipation.   Eat a variety of high-fiber foods rather than one type of fiber.   Try to increase your intake of fiber through using high-fiber foods rather than fiber pills or supplements that contain small amounts of fiber.   The goal is to change the types of food eaten. Do not supplement your present diet with high-fiber foods, but replace foods in your present diet.   INCLUDE A VARIETY OF FIBER SOURCES  Replace refined and processed grains with whole grains, canned fruits with fresh fruits, and incorporate other fiber sources. White rice, white breads, and most bakery goods contain little or no fiber.   Brown whole-grain rice, buckwheat oats, and many fruits and vegetables are all good sources of fiber. These include: broccoli, Brussels sprouts, cabbage, cauliflower, beets, sweet potatoes, white potatoes (skin on), carrots, tomatoes, eggplant, squash, berries, fresh fruits, and dried fruits.   Cereals appear to be the richest source of fiber. Cereal fiber is found in whole grains and bran. Bran is the fiber-rich outer coat of cereal grain, which is largely removed in refining. In whole-grain cereals, the bran remains. In breakfast cereals, the largest amount of fiber is found in those with "bran" in their names. The fiber content is sometimes indicated on the label.   You may need to include additional fruits and vegetables each day.   In baking, for 1 cup white flour, you may use the following substitutions:   1 cup whole-wheat flour minus 2 tablespoons.   1/2 cup white flour plus 1/2 cup whole-wheat flour.    Polyps, Colon  A polyp is extra tissue that grows inside your body. Colon polyps grow in the large intestine. The large intestine, also called the colon, is part of your digestive system. It is a long, hollow tube at the end of your digestive tract where your body makes and stores stool. Most polyps are not dangerous. They are benign. This means they are not cancerous. But over time, some types of polyps can turn into cancer. Polyps that are smaller than a pea are usually not harmful. But larger polyps could someday become or may already be cancerous. To be safe, doctors remove all polyps and test them.   PREVENTION There is not one sure way to prevent polyps. You might be able to lower your risk of getting them if you:  Eat more fruits and vegetables and less fatty food.   Do not smoke.   Avoid alcohol.   Exercise every day.   Lose weight if you  are overweight.   Eating more calcium and folate can also lower your risk of getting polyps. Some foods that are rich in calcium are milk, cheese, and broccoli. Some foods that are rich in folate are chickpeas, kidney beans, and spinach.   Hemorrhoids Hemorrhoids are dilated (enlarged) veins around the rectum. Sometimes clots will form in the veins. This makes them swollen and painful. These are called thrombosed hemorrhoids. Causes of hemorrhoids include:  Constipation.   Straining to have a bowel movement.   HEAVY LIFTING  HOME CARE INSTRUCTIONS  Eat a well balanced diet and drink 6 to 8 glasses of water every day to avoid constipation. You may also use a bulk laxative.   Avoid straining to have bowel movements.   Keep anal area dry and clean.   Do not use a donut shaped pillow or sit on the toilet for long periods. This increases blood pooling and pain.   Move your bowels when your body has the urge; this will require less straining and will decrease pain and pressure.

## 2016-12-12 NOTE — Op Note (Signed)
[facility] Patient Name: Megan Chan Procedure Date: 12/12/2016 11:14 AM MRN: [id number] Date of Birth: 1966-01-31 Attending MD: Barney Drain , MD CSN: [id number] Age: 51 Admit Type: Outpatient Procedure:                Colonoscopy,SCREENING Indications:              High risk colon cancer surveillance: Personal                            history of colonic polyps(2015-SIMLE ADENOM AND                            SERRATED ADENOMA). MOTHER HAD COLON CANCER AGE > 60. Providers:                Barney Drain, MD, Jeanann Lewandowsky. Sharon Seller, RN, Randa Spike, Technician Referring MD:             Manon Hilding MD, MD Medicines:                Propofol per Anesthesia Complications:            No immediate complications. Estimated Blood Loss:     Estimated blood loss: none. Procedure:                Pre-Anesthesia Assessment:                           - Prior to the procedure, a History and Physical                            was performed, and patient medications and                            allergies were reviewed. The patient's tolerance of                            previous anesthesia was also reviewed. The risks                            and benefits of the procedure and the sedation                            options and risks were discussed with the patient.                            All questions were answered, and informed consent                            was obtained. Prior Anticoagulants: The patient has                            taken aspirin. ASA Grade Assessment: II - A patient  with mild systemic disease. After reviewing the                            risks and benefits, the patient was deemed in                            satisfactory condition to undergo the procedure.                            After obtaining informed consent, the colonoscope                            was passed under direct vision.  Throughout the                            procedure, the patient's blood pressure, pulse, and                            oxygen saturations were monitored continuously. The                            [id number]) was introduced through the anus                            and advanced to the the cecum, identified by                            appendiceal orifice and ileocecal valve. The                            colonoscopy was technically difficult and complex                            due to a tortuous colon. Successful completion of                            the procedure was aided by straightening and                            shortening the scope to obtain bowel loop reduction                            and COLOWRAP. The patient tolerated the procedure                            fairly well. The quality of the bowel preparation                            was excellent. The ileocecal valve, appendiceal                            orifice, and rectum were photographed. Scope In: 11:26:43 AM Scope Out: 11:39:57 AM Scope Withdrawal Time: 0 hours 7 minutes 50 seconds  Total Procedure Duration: 0 hours 13 minutes 14 seconds  Findings:      The proximal rectum and recto-sigmoid colon were moderately redundant.      The exam was otherwise without abnormality.      Non-bleeding external and internal hemorrhoids were found during       retroflexion. The hemorrhoids were moderate. Impression:               - Redundant left colon.                           - The examination was otherwise normal.                           - Non-bleeding internal hemorrhoids. Moderate Sedation:      Per Anesthesia Care Recommendation:           - Repeat colonoscopy in 5 years for surveillance.                            ALL FIRST DEGREE RELATIVES NEED COLONOSCOPY AT AGE                            2.                           - High fiber diet.                           - Continue present  medications.                           - Patient has a contact number available for                            emergencies. The signs and symptoms of potential                            delayed complications were discussed with the                            patient. Return to normal activities tomorrow.                            Written discharge instructions were provided to the                            patient. Procedure Code(s):        --- Professional ---                           [phone], Colonoscopy, flexible; diagnostic, including                            collection of specimen(s) by brushing or washing,                            when performed (separate procedure) Diagnosis Code(s):        ---  Professional ---                           Z86.010, Personal history of colonic polyps                           K64.8, Other hemorrhoids                           Q43.8, Other specified congenital malformations of                            intestine CPT copyright 2016 American Medical Association. All rights reserved. The codes documented in this report are preliminary and upon coder review may  be revised to meet current compliance requirements. Barney Drain, MD Barney Drain, MD 12/12/2016 11:52:29 AM This report has been signed electronically. Number of Addenda: 0

## 2016-12-12 NOTE — Anesthesia Preprocedure Evaluation (Signed)
Anesthesia Evaluation  Patient identified by MRN, date of birth, ID band Patient awake    Reviewed: Allergy & Precautions, NPO status , Patient's Chart, lab work & pertinent test results  Airway Mallampati: II  TM Distance: >3 FB     Dental  (+) Teeth Intact   Pulmonary Current Smoker,    breath sounds clear to auscultation       Cardiovascular negative cardio ROS   Rhythm:Regular Rate:Normal     Neuro/Psych PSYCHIATRIC DISORDERS Anxiety Depression  Neuromuscular disease ( Trigeminal neuralgia , Hx multiple sclerosis  with generalized weakness.)    GI/Hepatic Neg liver ROS, GERD  ,  Endo/Other  negative endocrine ROS  Renal/GU negative Renal ROS     Musculoskeletal   Abdominal   Peds  Hematology negative hematology ROS (+)   Anesthesia Other Findings   Reproductive/Obstetrics                             Anesthesia Physical Anesthesia Plan  ASA: III  Anesthesia Plan: MAC   Post-op Pain Management:    Induction: Intravenous  Airway Management Planned: Simple Face Mask  Additional Equipment:   Intra-op Plan:   Post-operative Plan:   Informed Consent: I have reviewed the patients History and Physical, chart, labs and discussed the procedure including the risks, benefits and alternatives for the proposed anesthesia with the patient or authorized representative who has indicated his/her understanding and acceptance.     Plan Discussed with:   Anesthesia Plan Comments:         Anesthesia Quick Evaluation

## 2016-12-12 NOTE — Transfer of Care (Signed)
Immediate Anesthesia Transfer of Care Note  Patient: Megan Chan  Procedure(s) Performed: Procedure(s) with comments: COLONOSCOPY WITH PROPOFOL (N/A) - 1100  Patient Location: PACU  Anesthesia Type:MAC  Level of Consciousness: awake, oriented and patient cooperative  Airway & Oxygen Therapy: Patient Spontanous Breathing and Patient connected to face mask oxygen  Post-op Assessment: Report given to RN and Post -op Vital signs reviewed and stable  Post vital signs: Reviewed and stable  Last Vitals:  Vitals:   12/12/16 1030 12/12/16 1100  BP: 116/69 109/67  Pulse:    Resp: 18 14  Temp:      Last Pain:  Vitals:   12/12/16 0955  TempSrc: Oral         Complications: No apparent anesthesia complications

## 2016-12-12 NOTE — H&P (Signed)
Primary Care Physician:  Manon Hilding, MD Primary Gastroenterologist:  Dr. Oneida Alar  Pre-Procedure History & Physical: HPI:  Megan Chan is a 51 y.o. female here for  PERSONAL HISTORY OF POLYPS: 2015. Mom had colon cancer age > 29.  Past Medical History:  Diagnosis Date  . Anxiety   . Depression   . GERD (gastroesophageal reflux disease)   . Hypercholesterolemia   . Memory loss   . Multiple sclerosis ([place])   . Trigeminal neuralgia   . Trigeminal neuralgia   . Vision abnormalities     Past Surgical History:  Procedure Laterality Date  . ABDOMINAL HYSTERECTOMY    . COLONOSCOPY  2015   Dr. Britta Mccreedy: multiple polyps (7 in total), one polyp in sigmoid was 1 cm in size. tubular adenomas and serrated adenomas    Prior to Admission medications   Medication Sig Start Date End Date Taking? Authorizing Provider  ALPRAZolam Duanne Moron) 0.5 MG tablet Take 0.5 mg by mouth 2 (two) times daily as needed (for major panic attack).    Yes [provider]  aspirin-acetaminophen-caffeine (EXCEDRIN MIGRAINE) [phone] MG tablet Take 2 tablets by mouth daily as needed for headache.   Yes [provider]  baclofen (LIORESAL) 10 MG tablet Take 1 tablet (10 mg total) by mouth 3 (three) times daily. Patient taking differently: Take 10 mg by mouth 3 (three) times daily. Morning, noon, & night 08/01/16  Yes Sater, Nanine Means, MD  busPIRone (BUSPAR) 10 MG tablet Take 10 mg by mouth 2 (two) times daily. Am & night   Yes [provider]  cetirizine (ZYRTEC) 10 MG tablet Take 10 mg by mouth at bedtime.    Yes [provider]  cyclobenzaprine (FLEXERIL) 5 MG tablet Take 1 tablet (5 mg total) by mouth every 8 (eight) hours as needed for muscle spasms. 08/01/16  Yes Sater, Nanine Means, MD  FLUoxetine (PROZAC) 20 MG capsule Take 60 mg by mouth daily.    Yes [provider]  fluticasone (FLONASE) 50 MCG/ACT nasal spray Place 2 sprays into both nostrils daily.    Yes  [provider]  gabapentin (NEURONTIN) 600 MG tablet Take 1 tablet (600 mg total) by mouth 4 (four) times daily. Patient taking differently: Take 600 mg by mouth every 6 (six) hours.  06/14/16  Yes Sater, Nanine Means, MD  lamoTRIgine (LAMICTAL) 200 MG tablet Take 1 tablet three times daily Patient taking differently: Take 200 mg by mouth 2 (two) times daily. Am & night 08/01/16  Yes Sater, Nanine Means, MD  omeprazole (PRILOSEC) 20 MG capsule Take 20 mg by mouth daily before breakfast.    Yes [provider]  peg 3350 powder (MOVIPREP) 100 g SOLR Take 1 kit (200 g total) by mouth as directed. 11/08/16  Yes Chizaram Latino, Marga Melnick, MD  Polyethyl Glycol-Propyl Glycol (LUBRICANT EYE DROPS) 0.4-0.3 % SOLN Apply to eye.   Yes [provider]  rosuvastatin (CRESTOR) 10 MG tablet Take 10 mg by mouth at bedtime.    Yes [provider]  Teriflunomide 14 MG TABS Take 14 mg by mouth daily. Patient taking differently: Take 14 mg by mouth at bedtime. AUBAGIO 11/30/16  Yes Sater, Nanine Means, MD    Allergies as of 11/08/2016 - Review Complete 11/08/2016  Allergen Reaction Noted  . Codeine Other (See Comments) 08/25/2014  . Sulfa antibiotics Other (See Comments) 08/25/2014  . Penicillins Rash 08/25/2014    Family History  Problem Relation Age of Onset  . Stroke Mother   .  Parkinsonism Mother   . Colon cancer Mother 7  . Alzheimer's disease Father     Social History   Social History  . Marital status: Married    Spouse name: N/A  . Number of children: N/A  . Years of education: N/A   Occupational History  . Not on file.   Social History Main Topics  . Smoking status: Current Every Day Smoker    Packs/day: 0.50    Years: 20.00    Types: Cigarettes  . Smokeless tobacco: Never Used  . Alcohol use 1.2 oz/week    1 Glasses of wine, 1 Shots of liquor per week     Comment: occasionally  . Drug use: No  . Sexual activity: Yes    Birth control/ protection: Surgical    Other Topics Concern  . Not on file   Social History Narrative  . No narrative on file    Review of Systems: See HPI, otherwise negative ROS   Physical Exam: BP 116/69   Pulse 67   Temp 97.7 F (36.5 C) (Oral)   Resp 18   Ht _0  (1.6 m)   Wt 137 lb (62.1 kg)   SpO2 94%   BMI 24.27 kg/m  General:   Alert,  pleasant and cooperative in NAD Head:  Normocephalic and atraumatic. Neck:  Supple; Lungs:  Clear throughout to auscultation.    Heart:  Regular rate and rhythm. Abdomen:  Soft, nontender and nondistended. Normal bowel sounds, without guarding, and without rebound.   Neurologic:  Alert and  oriented x4;  grossly normal neurologically.  Impression/Plan:     PERSONAL HISTORY OF POLYPS.  PLAN: 1. TCS TODAY. DISCUSSED PROCEDURE, BENEFITS, & RISKS: < 1% chance of medication reaction, bleeding, perforation, or rupture of spleen/liver.

## 2016-12-12 NOTE — Anesthesia Postprocedure Evaluation (Signed)
Anesthesia Post Note  Patient: Megan Chan  Procedure(s) Performed: Procedure(s) (LRB): COLONOSCOPY WITH PROPOFOL (N/A)  Anesthesia Type: MAC Level of consciousness: awake and oriented Pain management: pain level controlled Vital Signs Assessment: post-procedure vital signs reviewed and stable Respiratory status: spontaneous breathing and patient connected to face mask oxygen Cardiovascular status: stable Postop Assessment: no signs of nausea or vomiting Anesthetic complications: no     Last Vitals:  Vitals:   12/12/16 1030 12/12/16 1100  BP: 116/69 109/67  Pulse:    Resp: 18 14  Temp:      Last Pain:  Vitals:   12/12/16 0955  TempSrc: Oral                 ADAMS, AMY A

## 2016-12-13 DIAGNOSIS — R7989 Other specified abnormal findings of blood chemistry: Secondary | ICD-10-CM | POA: Diagnosis not present

## 2016-12-13 DIAGNOSIS — E782 Mixed hyperlipidemia: Secondary | ICD-10-CM | POA: Diagnosis not present

## 2016-12-13 DIAGNOSIS — E119 Type 2 diabetes mellitus without complications: Secondary | ICD-10-CM | POA: Diagnosis not present

## 2016-12-13 DIAGNOSIS — E7800 Pure hypercholesterolemia, unspecified: Secondary | ICD-10-CM | POA: Diagnosis not present

## 2016-12-13 DIAGNOSIS — F324 Major depressive disorder, single episode, in partial remission: Secondary | ICD-10-CM | POA: Diagnosis not present

## 2016-12-13 DIAGNOSIS — G35 Multiple sclerosis: Secondary | ICD-10-CM | POA: Diagnosis not present

## 2016-12-15 ENCOUNTER — Encounter (HOSPITAL_COMMUNITY): Payer: Self-pay | Admitting: Gastroenterology

## 2016-12-20 ENCOUNTER — Telehealth: Payer: Self-pay

## 2016-12-20 NOTE — Telephone Encounter (Signed)
Pt called to ask for recent lab results and colonoscopy reports to be faxed to Dr. Quintin Alto.  Forwarding to Manuela Schwartz to do so.

## 2016-12-20 NOTE — Progress Notes (Signed)
PT is aware.

## 2016-12-21 NOTE — Telephone Encounter (Signed)
CC'ED TO PCP 

## 2016-12-26 DIAGNOSIS — F4001 Agoraphobia with panic disorder: Secondary | ICD-10-CM | POA: Diagnosis not present

## 2016-12-26 DIAGNOSIS — G500 Trigeminal neuralgia: Secondary | ICD-10-CM | POA: Diagnosis not present

## 2016-12-26 DIAGNOSIS — F411 Generalized anxiety disorder: Secondary | ICD-10-CM | POA: Diagnosis not present

## 2016-12-26 DIAGNOSIS — G35 Multiple sclerosis: Secondary | ICD-10-CM | POA: Diagnosis not present

## 2016-12-26 DIAGNOSIS — E782 Mixed hyperlipidemia: Secondary | ICD-10-CM | POA: Diagnosis not present

## 2016-12-26 DIAGNOSIS — F324 Major depressive disorder, single episode, in partial remission: Secondary | ICD-10-CM | POA: Diagnosis not present

## 2016-12-26 DIAGNOSIS — N951 Menopausal and female climacteric states: Secondary | ICD-10-CM | POA: Diagnosis not present

## 2016-12-26 DIAGNOSIS — Z6824 Body mass index (BMI) 24.0-24.9, adult: Secondary | ICD-10-CM | POA: Diagnosis not present

## 2017-01-11 ENCOUNTER — Telehealth: Payer: Self-pay | Admitting: Neurology

## 2017-01-11 NOTE — Telephone Encounter (Signed)
Patient called office in reference to left arm/leg/hand numbness going on for 1-2 weeks.  Patient also stated she would like to speak with RN about a few other questions would not give details.  Please call

## 2017-01-11 NOTE — Telephone Encounter (Signed)
I have spoken with Thayer Headings. She c/o worsening left arm and leg numbness over the last few weeks. Time to repeat MRI to check MS.  Some neck and back pain.  Has seen  her pcp who rec. f/u with RAS.  Appt. given 01/15/17/fim

## 2017-01-15 ENCOUNTER — Ambulatory Visit (INDEPENDENT_AMBULATORY_CARE_PROVIDER_SITE_OTHER): Payer: PPO | Admitting: Neurology

## 2017-01-15 ENCOUNTER — Encounter: Payer: Self-pay | Admitting: Neurology

## 2017-01-15 VITALS — BP 147/84 | HR 82 | Resp 16 | Ht 63.0 in | Wt 139.0 lb

## 2017-01-15 DIAGNOSIS — F418 Other specified anxiety disorders: Secondary | ICD-10-CM | POA: Diagnosis not present

## 2017-01-15 DIAGNOSIS — R200 Anesthesia of skin: Secondary | ICD-10-CM | POA: Diagnosis not present

## 2017-01-15 DIAGNOSIS — R350 Frequency of micturition: Secondary | ICD-10-CM

## 2017-01-15 DIAGNOSIS — R5383 Other fatigue: Secondary | ICD-10-CM | POA: Diagnosis not present

## 2017-01-15 DIAGNOSIS — G35 Multiple sclerosis: Secondary | ICD-10-CM | POA: Diagnosis not present

## 2017-01-15 DIAGNOSIS — G501 Atypical facial pain: Secondary | ICD-10-CM | POA: Diagnosis not present

## 2017-01-15 DIAGNOSIS — R261 Paralytic gait: Secondary | ICD-10-CM

## 2017-01-15 MED ORDER — GABAPENTIN 600 MG PO TABS: 600.0000 mg | ORAL_TABLET | Freq: Four times a day (QID) | ORAL | 11 refills | Status: DC

## 2017-01-15 MED ORDER — AMPHETAMINE-DEXTROAMPHET ER 20 MG PO CP24: 20.0000 mg | ORAL_CAPSULE | Freq: Every day | ORAL | 0 refills | Status: DC

## 2017-01-15 MED ORDER — LAMOTRIGINE 200 MG PO TABS: ORAL_TABLET | ORAL | 11 refills | Status: DC

## 2017-01-15 MED ORDER — BUSPIRONE HCL 15 MG PO TABS: 15.0000 mg | ORAL_TABLET | Freq: Two times a day (BID) | ORAL | 11 refills | Status: DC
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
## 2017-01-16 ENCOUNTER — Telehealth: Payer: Self-pay | Admitting: Neurology

## 2017-01-16 NOTE — Telephone Encounter (Signed)
Butch Penny with [employer] is calling regarding a form that was sent for PA for amphetamine-dextroamphetamine (ADDERALL XR) 20 MG 24 hr capsule. WNI#[id number]. Also this can be done as a verbal.

## 2017-01-16 NOTE — Telephone Encounter (Signed)
LMOM for Megan Chan to call back/fim

## 2017-01-31 ENCOUNTER — Ambulatory Visit
Admission: RE | Admit: 2017-01-31 | Discharge: 2017-01-31 | Disposition: A | Discharge status: Home / Self Care | Payer: PPO | Source: Ambulatory Visit | Attending: Neurology | Admitting: Neurology

## 2017-01-31 DIAGNOSIS — R200 Anesthesia of skin: Secondary | ICD-10-CM | POA: Diagnosis not present

## 2017-01-31 DIAGNOSIS — G35 Multiple sclerosis: Secondary | ICD-10-CM

## 2017-01-31 DIAGNOSIS — G501 Atypical facial pain: Secondary | ICD-10-CM

## 2017-01-31 DIAGNOSIS — N200 Calculus of kidney: Secondary | ICD-10-CM | POA: Diagnosis not present

## 2017-01-31 MED ORDER — GADOBENATE DIMEGLUMINE 529 MG/ML IV SOLN
15.0000 mL | Freq: Once | INTRAVENOUS | Status: AC | PRN
  Administered 2017-01-31: 12 mL via INTRAVENOUS

## 2017-02-01 ENCOUNTER — Ambulatory Visit: Payer: PPO | Admitting: Neurology

## 2017-02-02 ENCOUNTER — Telehealth: Payer: Self-pay | Admitting: Neurology

## 2017-02-02 NOTE — Telephone Encounter (Signed)
I discussed the MRI with Megan Chan. 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 We discussed that the Megan Chan is not working as well as would like to an we need to consider a different medication. She will come in on Monday to discuss this further.  In 2016, she switch from Betaseron to Aubagio due to an exacerbation and enhancing lesions on the MRI (4 of them)

## 2017-02-05 ENCOUNTER — Encounter: Payer: Self-pay | Admitting: *Deleted

## 2017-02-05 ENCOUNTER — Ambulatory Visit (INDEPENDENT_AMBULATORY_CARE_PROVIDER_SITE_OTHER): Payer: PPO | Admitting: Neurology

## 2017-02-05 ENCOUNTER — Telehealth: Payer: Self-pay | Admitting: *Deleted

## 2017-02-05 ENCOUNTER — Encounter: Payer: Self-pay | Admitting: Neurology

## 2017-02-05 VITALS — BP 146/60 | HR 80 | Resp 18 | Ht 63.0 in | Wt 140.5 lb

## 2017-02-05 DIAGNOSIS — R261 Paralytic gait: Secondary | ICD-10-CM

## 2017-02-05 DIAGNOSIS — R350 Frequency of micturition: Secondary | ICD-10-CM

## 2017-02-05 DIAGNOSIS — Z79899 Other long term (current) drug therapy: Secondary | ICD-10-CM

## 2017-02-05 DIAGNOSIS — G35 Multiple sclerosis: Secondary | ICD-10-CM

## 2017-02-05 NOTE — Telephone Encounter (Signed)
Pt. coming in to the office this afternoon to discuss MRI, other tx. options/fim

## 2017-02-05 NOTE — Telephone Encounter (Signed)
-----   Message from Britt Bottom, MD sent at 02/02/2017 12:25 PM EDT ----- I spoke to [place] about her MRI showing right through disease. She will come in on Monday to discuss a different disease modifying therapy.
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
## 2017-02-06 ENCOUNTER — Telehealth: Payer: Self-pay | Admitting: *Deleted

## 2017-02-06 LAB — CBC WITH DIFFERENTIAL/PLATELET
BASOS ABS: 0 10*3/uL (ref 0.0–0.2)
BASOS: 0 %
EOS (ABSOLUTE): 0.3 10*3/uL (ref 0.0–0.4)
EOS: 3 %
HEMOGLOBIN: 13 g/dL (ref 11.1–15.9)
Hematocrit: 39.9 % (ref 34.0–46.6)
IMMATURE GRANS (ABS): 0 10*3/uL (ref 0.0–0.1)
IMMATURE GRANULOCYTES: 0 %
Lymphocytes Absolute: 3 10*3/uL (ref 0.7–3.1)
Lymphs: 27 %
MCH: 31 pg (ref 26.6–33.0)
MCHC: 32.6 g/dL (ref 31.5–35.7)
MCV: 95 fL (ref 79–97)
Monocytes Absolute: 0.9 10*3/uL (ref 0.1–0.9)
Monocytes: 8 %
Neutrophils Absolute: 6.8 10*3/uL (ref 1.4–7.0)
Neutrophils: 62 %
PLATELETS: 282 10*3/uL (ref 150–379)
RBC: 4.2 x10E6/uL (ref 3.77–5.28)
RDW: 15.2 % (ref 12.3–15.4)
WBC: 11 10*3/uL — AB (ref 3.4–10.8)

## 2017-02-06 NOTE — Telephone Encounter (Signed)
PA for both Tecfidera starter pk and maintenance dose of 240mg  po bid completed by phone with [facility].  Dx: RRMS  Tried and failed meds: Betaseron (2009-2016) and Aubagio (08/2014-01/2017), both were ineffective./fim

## 2017-02-08 NOTE — Telephone Encounter (Signed)
Fax received from [employer]	[place] (phone# [phone]).  Tecfidera starter pk. approved for dates 02/08/17 thru 07/16/17. Tecfidera maintenance dose of 240mg  bid also approved for dates 02/08/17 thru 07/16/17./fim

## 2017-02-20 DIAGNOSIS — Z23 Encounter for immunization: Secondary | ICD-10-CM | POA: Diagnosis not present

## 2017-02-20 DIAGNOSIS — K219 Gastro-esophageal reflux disease without esophagitis: Secondary | ICD-10-CM | POA: Diagnosis not present

## 2017-02-20 DIAGNOSIS — S0101XA Laceration without foreign body of scalp, initial encounter: Secondary | ICD-10-CM | POA: Diagnosis not present

## 2017-02-20 DIAGNOSIS — F17200 Nicotine dependence, unspecified, uncomplicated: Secondary | ICD-10-CM | POA: Diagnosis not present

## 2017-02-20 DIAGNOSIS — E785 Hyperlipidemia, unspecified: Secondary | ICD-10-CM | POA: Diagnosis not present

## 2017-02-20 DIAGNOSIS — G35 Multiple sclerosis: Secondary | ICD-10-CM | POA: Diagnosis not present

## 2017-02-20 DIAGNOSIS — Z79899 Other long term (current) drug therapy: Secondary | ICD-10-CM | POA: Diagnosis not present

## 2017-02-20 DIAGNOSIS — F419 Anxiety disorder, unspecified: Secondary | ICD-10-CM | POA: Diagnosis not present

## 2017-02-20 DIAGNOSIS — W07XXXA Fall from chair, initial encounter: Secondary | ICD-10-CM | POA: Diagnosis not present

## 2017-02-27 DIAGNOSIS — Z4802 Encounter for removal of sutures: Secondary | ICD-10-CM | POA: Diagnosis not present

## 2017-02-27 DIAGNOSIS — S0101XD Laceration without foreign body of scalp, subsequent encounter: Secondary | ICD-10-CM | POA: Diagnosis not present

## 2017-05-28 ENCOUNTER — Telehealth: Payer: Self-pay | Admitting: Neurology

## 2017-05-28 NOTE — Telephone Encounter (Signed)
Spoke with Megan Chan--she c/o increased left arm weakness, rom.  Denies pain.  Also more difficulty walking since she started Tecfidera--severe falls with injuries, such as lac. to head, requiring sutures. Appt. for eval given with RAS tomorrow am 1030.  She verbalized understanding of same/fim

## 2017-05-28 NOTE — Telephone Encounter (Signed)
Pt called she is wanting RX for prednisone. Pt said she can hardly use her left arm for the past 2-3 weeks. She is wanting to know if a script for prednisone could be sent to [facility]. Please call to discuss

## 2017-05-29 ENCOUNTER — Encounter: Payer: Self-pay | Admitting: Neurology

## 2017-05-29 ENCOUNTER — Ambulatory Visit (INDEPENDENT_AMBULATORY_CARE_PROVIDER_SITE_OTHER): Payer: PPO | Admitting: Neurology

## 2017-05-29 DIAGNOSIS — G35 Multiple sclerosis: Secondary | ICD-10-CM

## 2017-05-29 DIAGNOSIS — Z79899 Other long term (current) drug therapy: Secondary | ICD-10-CM | POA: Diagnosis not present

## 2017-05-29 DIAGNOSIS — R5383 Other fatigue: Secondary | ICD-10-CM

## 2017-05-29 DIAGNOSIS — R350 Frequency of micturition: Secondary | ICD-10-CM

## 2017-05-29 DIAGNOSIS — R261 Paralytic gait: Secondary | ICD-10-CM | POA: Diagnosis not present
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
## 2017-05-30 LAB — CBC WITH DIFFERENTIAL/PLATELET
BASOS ABS: 0 10*3/uL (ref 0.0–0.2)
Basos: 0 %
EOS (ABSOLUTE): 0.2 10*3/uL (ref 0.0–0.4)
Eos: 2 %
Hematocrit: 41 % (ref 34.0–46.6)
Hemoglobin: 13.7 g/dL (ref 11.1–15.9)
Immature Grans (Abs): 0 10*3/uL (ref 0.0–0.1)
Immature Granulocytes: 0 %
LYMPHS ABS: 2.7 10*3/uL (ref 0.7–3.1)
Lymphs: 24 %
MCH: 32.3 pg (ref 26.6–33.0)
MCHC: 33.4 g/dL (ref 31.5–35.7)
MCV: 97 fL (ref 79–97)
Monocytes Absolute: 0.8 10*3/uL (ref 0.1–0.9)
Monocytes: 7 %
NEUTROS ABS: 7.5 10*3/uL — AB (ref 1.4–7.0)
Neutrophils: 67 %
PLATELETS: 328 10*3/uL (ref 150–379)
RBC: 4.24 x10E6/uL (ref 3.77–5.28)
RDW: 15.3 % (ref 12.3–15.4)
WBC: 11.2 10*3/uL — ABNORMAL HIGH (ref 3.4–10.8)

## 2017-05-30 LAB — COMPREHENSIVE METABOLIC PANEL
ALBUMIN: 4.5 g/dL (ref 3.5–5.5)
ALK PHOS: 206 IU/L — AB (ref 39–117)
ALT: 24 IU/L (ref 0–32)
AST: 26 IU/L (ref 0–40)
Albumin/Globulin Ratio: 1.9 (ref 1.2–2.2)
BILIRUBIN TOTAL: 0.3 mg/dL (ref 0.0–1.2)
BUN / CREAT RATIO: 10 (ref 9–23)
BUN: 7 mg/dL (ref 6–24)
CHLORIDE: 101 mmol/L (ref 96–106)
CO2: 27 mmol/L (ref 20–29)
Calcium: 9.6 mg/dL (ref 8.7–10.2)
Creatinine, Ser: 0.69 mg/dL (ref 0.57–1.00)
GFR calc Af Amer: 117 mL/min/{1.73_m2} (ref >59)
GFR calc non Af Amer: 102 mL/min/{1.73_m2} (ref >59)
GLUCOSE: 79 mg/dL (ref 65–99)
Globulin, Total: 2.4 g/dL (ref 1.5–4.5)
POTASSIUM: 4.3 mmol/L (ref 3.5–5.2)
Sodium: 141 mmol/L (ref 134–144)
Total Protein: 6.9 g/dL (ref 6.0–8.5)

## 2017-05-31 ENCOUNTER — Telehealth: Payer: Self-pay | Admitting: *Deleted

## 2017-05-31 MED ORDER — PREDNISONE 50 MG PO TABS: ORAL_TABLET | ORAL | 0 refills | Status: DC

## 2017-05-31 NOTE — Telephone Encounter (Signed)
Spoke with Megan Chan this am and reviewed lab results with her. She verbalized understanding of same/fim

## 2017-05-31 NOTE — Telephone Encounter (Signed)
-----   Message from Britt Bottom, MD sent at 05/30/2017 12:57 PM EST ----- Please let the patient know that the lab work is fine.

## 2017-05-31 NOTE — Telephone Encounter (Signed)
Pt. requesting oral steroids for MS sx. (primarily gait disturbance, fatigue).  She does not take steroids often.  Per RAS, ok for 2 days of IV SM 1gm daily.  Pt. unable to come into the office, due to transportation, weather.  Per RAS, ok for high dose oral steroids.  Pt. agreeable.  Rx. sent to [facility] per her request/fim

## 2017-06-04 ENCOUNTER — Encounter: Payer: Self-pay | Admitting: *Deleted

## 2017-06-22 DIAGNOSIS — E782 Mixed hyperlipidemia: Secondary | ICD-10-CM | POA: Diagnosis not present

## 2017-06-22 DIAGNOSIS — G35 Multiple sclerosis: Secondary | ICD-10-CM | POA: Diagnosis not present

## 2017-06-22 DIAGNOSIS — E119 Type 2 diabetes mellitus without complications: Secondary | ICD-10-CM | POA: Diagnosis not present

## 2017-06-22 DIAGNOSIS — E7800 Pure hypercholesterolemia, unspecified: Secondary | ICD-10-CM | POA: Diagnosis not present

## 2017-06-22 DIAGNOSIS — R945 Abnormal results of liver function studies: Secondary | ICD-10-CM | POA: Diagnosis not present

## 2017-06-27 DIAGNOSIS — F4001 Agoraphobia with panic disorder: Secondary | ICD-10-CM | POA: Diagnosis not present

## 2017-06-27 DIAGNOSIS — F324 Major depressive disorder, single episode, in partial remission: Secondary | ICD-10-CM | POA: Diagnosis not present

## 2017-06-27 DIAGNOSIS — F411 Generalized anxiety disorder: Secondary | ICD-10-CM | POA: Diagnosis not present

## 2017-06-27 DIAGNOSIS — E782 Mixed hyperlipidemia: Secondary | ICD-10-CM | POA: Diagnosis not present

## 2017-06-27 DIAGNOSIS — G35 Multiple sclerosis: Secondary | ICD-10-CM | POA: Diagnosis not present

## 2017-06-27 DIAGNOSIS — N951 Menopausal and female climacteric states: Secondary | ICD-10-CM | POA: Diagnosis not present

## 2017-06-27 DIAGNOSIS — Z6824 Body mass index (BMI) 24.0-24.9, adult: Secondary | ICD-10-CM | POA: Diagnosis not present

## 2017-06-27 DIAGNOSIS — G500 Trigeminal neuralgia: Secondary | ICD-10-CM | POA: Diagnosis not present

## 2017-07-26 ENCOUNTER — Ambulatory Visit: Payer: PPO | Admitting: Neurology

## 2017-08-08 ENCOUNTER — Other Ambulatory Visit: Payer: Self-pay | Admitting: Neurology

## 2017-08-14 DIAGNOSIS — Z6825 Body mass index (BMI) 25.0-25.9, adult: Secondary | ICD-10-CM | POA: Diagnosis not present

## 2017-08-14 DIAGNOSIS — R350 Frequency of micturition: Secondary | ICD-10-CM | POA: Diagnosis not present

## 2017-08-14 DIAGNOSIS — M545 Low back pain: Secondary | ICD-10-CM | POA: Diagnosis not present

## 2017-08-21 ENCOUNTER — Ambulatory Visit
Admission: RE | Admit: 2017-08-21 | Discharge: 2017-08-21 | Disposition: A | Discharge status: Home / Self Care | Payer: PPO | Source: Ambulatory Visit | Attending: Neurology | Admitting: Neurology

## 2017-08-21 DIAGNOSIS — G35 Multiple sclerosis: Secondary | ICD-10-CM

## 2017-08-21 MED ORDER — GADOBENATE DIMEGLUMINE 529 MG/ML IV SOLN: 13.0000 mL | Freq: Once | INTRAVENOUS | Status: DC | PRN

## 2017-08-21 MED ORDER — GADOBENATE DIMEGLUMINE 529 MG/ML IV SOLN
13.0000 mL | Freq: Once | INTRAVENOUS | Status: AC | PRN
  Administered 2017-08-21: 13 mL via INTRAVENOUS

## 2017-08-22 ENCOUNTER — Telehealth: Payer: Self-pay | Admitting: *Deleted

## 2017-08-22 NOTE — Telephone Encounter (Signed)
-----   Message from Britt Bottom, MD sent at 08/22/2017  4:40 PM EST ----- Please let her know that the MRI did not show any new lesions. The single lesion on the last MRI looks better on the current one

## 2017-08-22 NOTE — Telephone Encounter (Signed)
Spoke with Thayer Headings and reviewed below MRI report with her.  She verbalized understanding of same/fim

## 2017-08-29 ENCOUNTER — Ambulatory Visit: Payer: PPO | Admitting: Neurology

## 2017-10-24 ENCOUNTER — Other Ambulatory Visit: Payer: Self-pay

## 2017-10-24 ENCOUNTER — Ambulatory Visit (INDEPENDENT_AMBULATORY_CARE_PROVIDER_SITE_OTHER): Payer: PPO | Admitting: Neurology

## 2017-10-24 ENCOUNTER — Encounter: Payer: Self-pay | Admitting: Neurology

## 2017-10-24 VITALS — BP 113/72 | HR 81 | Resp 16 | Ht 63.0 in | Wt 139.0 lb

## 2017-10-24 DIAGNOSIS — G501 Atypical facial pain: Secondary | ICD-10-CM | POA: Diagnosis not present

## 2017-10-24 DIAGNOSIS — G4700 Insomnia, unspecified: Secondary | ICD-10-CM | POA: Diagnosis not present

## 2017-10-24 DIAGNOSIS — G35 Multiple sclerosis: Secondary | ICD-10-CM | POA: Diagnosis not present

## 2017-10-24 DIAGNOSIS — R200 Anesthesia of skin: Secondary | ICD-10-CM

## 2017-10-24 DIAGNOSIS — Z79899 Other long term (current) drug therapy: Secondary | ICD-10-CM | POA: Diagnosis not present

## 2017-10-24 DIAGNOSIS — R261 Paralytic gait: Secondary | ICD-10-CM | POA: Diagnosis not present

## 2017-10-24 DIAGNOSIS — R7989 Other specified abnormal findings of blood chemistry: Secondary | ICD-10-CM | POA: Diagnosis not present

## 2017-10-24 DIAGNOSIS — R5383 Other fatigue: Secondary | ICD-10-CM
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
## 2017-10-24 NOTE — Patient Instructions (Signed)
Change gabapentin to one pill in the morning, one pill in the afternoon and two pills at bedtime  Stop the daytime cyclobenzaprine and only take the bedtime dose

## 2017-10-25 ENCOUNTER — Encounter: Payer: Self-pay | Admitting: Neurology

## 2017-10-25 ENCOUNTER — Other Ambulatory Visit: Payer: Self-pay | Admitting: Neurology

## 2017-10-25 ENCOUNTER — Telehealth: Payer: Self-pay | Admitting: Neurology

## 2017-10-25 LAB — CBC WITH DIFFERENTIAL/PLATELET
BASOS: 0 %
Basophils Absolute: 0 10*3/uL (ref 0.0–0.2)
EOS (ABSOLUTE): 0.1 10*3/uL (ref 0.0–0.4)
Eos: 2 %
Hematocrit: 41.7 % (ref 34.0–46.6)
Hemoglobin: 13.9 g/dL (ref 11.1–15.9)
Immature Grans (Abs): 0 10*3/uL (ref 0.0–0.1)
Immature Granulocytes: 0 %
LYMPHS: 25 %
Lymphocytes Absolute: 1.5 10*3/uL (ref 0.7–3.1)
MCH: 31.7 pg (ref 26.6–33.0)
MCHC: 33.3 g/dL (ref 31.5–35.7)
MCV: 95 fL (ref 79–97)
Monocytes Absolute: 0.5 10*3/uL (ref 0.1–0.9)
Monocytes: 8 %
NEUTROS ABS: 3.9 10*3/uL (ref 1.4–7.0)
Neutrophils: 65 %
PLATELETS: 295 10*3/uL (ref 150–379)
RBC: 4.39 x10E6/uL (ref 3.77–5.28)
RDW: 14.5 % (ref 12.3–15.4)
WBC: 6 10*3/uL (ref 3.4–10.8)

## 2017-10-25 LAB — VITAMIN B12: Vitamin B-12: 157 pg/mL — ABNORMAL LOW (ref 232–1245)

## 2017-10-25 LAB — TSH: TSH: 2.35 u[IU]/mL (ref 0.450–4.500)

## 2017-10-25 LAB — VITAMIN D 25 HYDROXY (VIT D DEFICIENCY, FRACTURES): Vit D, 25-Hydroxy: 18.1 ng/mL — ABNORMAL LOW (ref 30.0–100.0)

## 2017-10-25 MED ORDER — VITAMIN D (ERGOCALCIFEROL) 1.25 MG (50000 UNIT) PO CAPS: 50000.0000 [IU] | ORAL_CAPSULE | ORAL | 5 refills | Status: DC

## 2017-10-25 NOTE — Telephone Encounter (Signed)
This encounter was created in error - please disregard.

## 2017-10-25 NOTE — Telephone Encounter (Signed)
Called the patient and made her aware that her vitamin D and b12 level's were low. Dr Felecia Shelling is recommending the patient start taking Vit b12 shots once a month as well as 50,000 units of vitamin D weekly for 6 months. I educated the patient on these and informed her that we will send the orders for the pt to [facility] in [place]. Pt verbalized understanding and was appreciative for the call.

## 2017-10-31 NOTE — Telephone Encounter (Signed)
Pt calling to inform that the B12 shot order information has not been sent to her PCP.  Pt is asking for a call back from RN Faith

## 2017-10-31 NOTE — Telephone Encounter (Signed)
Spoke with [facility] Headings.  Vit. B12 level and orders for inj. were e-faxed.  I called Dr. Edythe Lynn office, have faxed same to them at [phone]

## 2017-11-01 DIAGNOSIS — D519 Vitamin B12 deficiency anemia, unspecified: Secondary | ICD-10-CM | POA: Diagnosis not present

## 2017-11-14 DIAGNOSIS — Z6823 Body mass index (BMI) 23.0-23.9, adult: Secondary | ICD-10-CM | POA: Diagnosis not present

## 2017-11-14 DIAGNOSIS — N3001 Acute cystitis with hematuria: Secondary | ICD-10-CM | POA: Diagnosis not present

## 2017-11-14 DIAGNOSIS — R300 Dysuria: Secondary | ICD-10-CM | POA: Diagnosis not present

## 2017-11-29 DIAGNOSIS — D519 Vitamin B12 deficiency anemia, unspecified: Secondary | ICD-10-CM | POA: Diagnosis not present

## 2017-12-03 DIAGNOSIS — R300 Dysuria: Secondary | ICD-10-CM | POA: Diagnosis not present

## 2017-12-03 DIAGNOSIS — R350 Frequency of micturition: Secondary | ICD-10-CM | POA: Diagnosis not present

## 2017-12-21 DIAGNOSIS — E782 Mixed hyperlipidemia: Secondary | ICD-10-CM | POA: Diagnosis not present

## 2017-12-21 DIAGNOSIS — E559 Vitamin D deficiency, unspecified: Secondary | ICD-10-CM | POA: Diagnosis not present

## 2017-12-21 DIAGNOSIS — D519 Vitamin B12 deficiency anemia, unspecified: Secondary | ICD-10-CM | POA: Diagnosis not present

## 2017-12-21 DIAGNOSIS — R945 Abnormal results of liver function studies: Secondary | ICD-10-CM | POA: Diagnosis not present

## 2017-12-21 DIAGNOSIS — R7989 Other specified abnormal findings of blood chemistry: Secondary | ICD-10-CM | POA: Diagnosis not present

## 2017-12-21 DIAGNOSIS — E7800 Pure hypercholesterolemia, unspecified: Secondary | ICD-10-CM | POA: Diagnosis not present

## 2017-12-21 DIAGNOSIS — Z79899 Other long term (current) drug therapy: Secondary | ICD-10-CM | POA: Diagnosis not present

## 2017-12-26 DIAGNOSIS — D519 Vitamin B12 deficiency anemia, unspecified: Secondary | ICD-10-CM | POA: Diagnosis not present

## 2017-12-26 DIAGNOSIS — F4001 Agoraphobia with panic disorder: Secondary | ICD-10-CM | POA: Diagnosis not present

## 2017-12-26 DIAGNOSIS — Z0001 Encounter for general adult medical examination with abnormal findings: Secondary | ICD-10-CM | POA: Diagnosis not present

## 2017-12-26 DIAGNOSIS — G35 Multiple sclerosis: Secondary | ICD-10-CM | POA: Diagnosis not present

## 2017-12-26 DIAGNOSIS — E559 Vitamin D deficiency, unspecified: Secondary | ICD-10-CM | POA: Diagnosis not present

## 2017-12-26 DIAGNOSIS — Z6823 Body mass index (BMI) 23.0-23.9, adult: Secondary | ICD-10-CM | POA: Diagnosis not present

## 2017-12-26 DIAGNOSIS — G500 Trigeminal neuralgia: Secondary | ICD-10-CM | POA: Diagnosis not present

## 2017-12-26 DIAGNOSIS — N951 Menopausal and female climacteric states: Secondary | ICD-10-CM | POA: Diagnosis not present

## 2017-12-26 DIAGNOSIS — E782 Mixed hyperlipidemia: Secondary | ICD-10-CM | POA: Diagnosis not present

## 2018-01-07 ENCOUNTER — Other Ambulatory Visit: Payer: Self-pay | Admitting: Neurology

## 2018-01-22 DIAGNOSIS — D519 Vitamin B12 deficiency anemia, unspecified: Secondary | ICD-10-CM | POA: Diagnosis not present

## 2018-01-25 ENCOUNTER — Other Ambulatory Visit: Payer: Self-pay | Admitting: Neurology

## 2018-02-26 DIAGNOSIS — D519 Vitamin B12 deficiency anemia, unspecified: Secondary | ICD-10-CM | POA: Diagnosis not present

## 2018-02-27 ENCOUNTER — Other Ambulatory Visit: Payer: Self-pay | Admitting: Neurology

## 2018-04-03 DIAGNOSIS — D519 Vitamin B12 deficiency anemia, unspecified: Secondary | ICD-10-CM | POA: Diagnosis not present

## 2018-04-25 ENCOUNTER — Other Ambulatory Visit: Payer: Self-pay

## 2018-04-25 ENCOUNTER — Ambulatory Visit (INDEPENDENT_AMBULATORY_CARE_PROVIDER_SITE_OTHER): Payer: PPO | Admitting: Neurology

## 2018-04-25 ENCOUNTER — Encounter: Payer: Self-pay | Admitting: Neurology

## 2018-04-25 VITALS — BP 102/63 | HR 62 | Resp 16 | Ht 63.0 in | Wt 132.0 lb

## 2018-04-25 DIAGNOSIS — R261 Paralytic gait: Secondary | ICD-10-CM

## 2018-04-25 DIAGNOSIS — R350 Frequency of micturition: Secondary | ICD-10-CM | POA: Diagnosis not present

## 2018-04-25 DIAGNOSIS — R5383 Other fatigue: Secondary | ICD-10-CM | POA: Diagnosis not present

## 2018-04-25 DIAGNOSIS — G35 Multiple sclerosis: Secondary | ICD-10-CM | POA: Diagnosis not present

## 2018-04-25 DIAGNOSIS — Z79899 Other long term (current) drug therapy: Secondary | ICD-10-CM

## 2018-04-25 MED ORDER — HYDROXYZINE HCL 25 MG PO TABS: 25.0000 mg | ORAL_TABLET | Freq: Every day | ORAL | 5 refills | Status: DC
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
## 2018-04-25 NOTE — Patient Instructions (Signed)
Stop cyclobenzaprine.   Start hydroxyzine only at bedtime.    Continue other medications

## 2018-04-26 ENCOUNTER — Telehealth: Payer: Self-pay | Admitting: *Deleted

## 2018-04-26 LAB — CBC WITH DIFFERENTIAL/PLATELET
Basophils Absolute: 0.1 10*3/uL (ref 0.0–0.2)
Basos: 1 %
EOS (ABSOLUTE): 0.2 10*3/uL (ref 0.0–0.4)
EOS: 3 %
HEMATOCRIT: 37.6 % (ref 34.0–46.6)
HEMOGLOBIN: 13.6 g/dL (ref 11.1–15.9)
Immature Grans (Abs): 0 10*3/uL (ref 0.0–0.1)
Immature Granulocytes: 0 %
LYMPHS ABS: 1.5 10*3/uL (ref 0.7–3.1)
Lymphs: 22 %
MCH: 32.8 pg (ref 26.6–33.0)
MCHC: 36.2 g/dL — AB (ref 31.5–35.7)
MCV: 91 fL (ref 79–97)
MONOCYTES: 8 %
Monocytes Absolute: 0.5 10*3/uL (ref 0.1–0.9)
NEUTROS ABS: 4.6 10*3/uL (ref 1.4–7.0)
Neutrophils: 66 %
Platelets: 221 10*3/uL (ref 150–450)
RBC: 4.15 x10E6/uL (ref 3.77–5.28)
RDW: 12.6 % (ref 12.3–15.4)
WBC: 6.8 10*3/uL (ref 3.4–10.8)

## 2018-04-26 NOTE — Telephone Encounter (Signed)
-----   Message from Britt Bottom, MD sent at 04/26/2018 10:02 AM EDT ----- Please let the patient know that the lab work is fine.

## 2018-04-26 NOTE — Telephone Encounter (Signed)
LMOM with below lab results.  She does not need to return this call unless she has questions/fim 

## 2018-05-13 ENCOUNTER — Other Ambulatory Visit: Payer: Self-pay | Admitting: Neurology

## 2018-06-11 ENCOUNTER — Other Ambulatory Visit: Payer: Self-pay | Admitting: Neurology

## 2018-06-20 DIAGNOSIS — M940 Chondrocostal junction syndrome [Tietze]: Secondary | ICD-10-CM | POA: Diagnosis not present

## 2018-06-20 DIAGNOSIS — Z6823 Body mass index (BMI) 23.0-23.9, adult: Secondary | ICD-10-CM | POA: Diagnosis not present

## 2018-06-21 DIAGNOSIS — Z79899 Other long term (current) drug therapy: Secondary | ICD-10-CM | POA: Diagnosis not present

## 2018-06-21 DIAGNOSIS — G35 Multiple sclerosis: Secondary | ICD-10-CM | POA: Diagnosis not present

## 2018-06-21 DIAGNOSIS — E7800 Pure hypercholesterolemia, unspecified: Secondary | ICD-10-CM | POA: Diagnosis not present

## 2018-06-21 DIAGNOSIS — E782 Mixed hyperlipidemia: Secondary | ICD-10-CM | POA: Diagnosis not present

## 2018-06-21 DIAGNOSIS — E119 Type 2 diabetes mellitus without complications: Secondary | ICD-10-CM | POA: Diagnosis not present

## 2018-06-21 DIAGNOSIS — D519 Vitamin B12 deficiency anemia, unspecified: Secondary | ICD-10-CM | POA: Diagnosis not present

## 2018-06-21 DIAGNOSIS — E559 Vitamin D deficiency, unspecified: Secondary | ICD-10-CM | POA: Diagnosis not present

## 2018-06-25 DIAGNOSIS — Z6824 Body mass index (BMI) 24.0-24.9, adult: Secondary | ICD-10-CM | POA: Diagnosis not present

## 2018-06-25 DIAGNOSIS — D519 Vitamin B12 deficiency anemia, unspecified: Secondary | ICD-10-CM | POA: Diagnosis not present

## 2018-06-25 DIAGNOSIS — Z1389 Encounter for screening for other disorder: Secondary | ICD-10-CM | POA: Diagnosis not present

## 2018-06-25 DIAGNOSIS — G252 Other specified forms of tremor: Secondary | ICD-10-CM | POA: Diagnosis not present

## 2018-06-25 DIAGNOSIS — E782 Mixed hyperlipidemia: Secondary | ICD-10-CM | POA: Diagnosis not present

## 2018-06-25 DIAGNOSIS — G35 Multiple sclerosis: Secondary | ICD-10-CM | POA: Diagnosis not present

## 2018-06-25 DIAGNOSIS — Z1331 Encounter for screening for depression: Secondary | ICD-10-CM | POA: Diagnosis not present

## 2018-06-25 DIAGNOSIS — F4001 Agoraphobia with panic disorder: Secondary | ICD-10-CM | POA: Diagnosis not present

## 2018-10-28 ENCOUNTER — Telehealth: Payer: Self-pay | Admitting: *Deleted

## 2018-10-28 NOTE — Telephone Encounter (Signed)
Called and spoke with pt. She was unable to go over chart info at time of call d/t taking care of father. She will call back before 5pm today to go over her information prior to virtual visit tomorrow with Dr. Felecia Shelling.

## 2018-10-28 NOTE — Telephone Encounter (Signed)
I called pt. Updated pt medication list/pharmacy/allergy list.   She would like email resent for virtual visit tomorrow: [email or web address].  I resent the email for her.

## 2018-10-29 ENCOUNTER — Other Ambulatory Visit: Payer: Self-pay

## 2018-10-29 ENCOUNTER — Ambulatory Visit (INDEPENDENT_AMBULATORY_CARE_PROVIDER_SITE_OTHER): Payer: PPO | Admitting: Neurology

## 2018-10-29 ENCOUNTER — Encounter: Payer: Self-pay | Admitting: Neurology

## 2018-10-29 DIAGNOSIS — G35 Multiple sclerosis: Secondary | ICD-10-CM

## 2018-10-29 DIAGNOSIS — R350 Frequency of micturition: Secondary | ICD-10-CM | POA: Diagnosis not present

## 2018-10-29 DIAGNOSIS — G501 Atypical facial pain: Secondary | ICD-10-CM

## 2018-10-29 DIAGNOSIS — R5383 Other fatigue: Secondary | ICD-10-CM

## 2018-10-29 DIAGNOSIS — R200 Anesthesia of skin: Secondary | ICD-10-CM | POA: Diagnosis not present

## 2018-10-29 DIAGNOSIS — R261 Paralytic gait: Secondary | ICD-10-CM | POA: Diagnosis not present

## 2018-10-29 DIAGNOSIS — G4700 Insomnia, unspecified: Secondary | ICD-10-CM | POA: Diagnosis not present

## 2018-10-29 MED ORDER — HYDROXYZINE HCL 25 MG PO TABS: 25.0000 mg | ORAL_TABLET | Freq: Every day | ORAL | 5 refills | Status: DC
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
## 2018-10-30 ENCOUNTER — Telehealth: Payer: Self-pay | Admitting: Neurology

## 2018-10-30 NOTE — Telephone Encounter (Signed)
10/30/18 - LVM to schedule 6 month follow-up per Dr. Felecia Shelling

## 2018-12-10 ENCOUNTER — Other Ambulatory Visit: Payer: Self-pay | Admitting: *Deleted

## 2018-12-10 MED ORDER — TECFIDERA 240 MG PO CPDR: DELAYED_RELEASE_CAPSULE | ORAL | 11 refills | Status: DC

## 2018-12-10 NOTE — Progress Notes (Signed)
Called and spoke with pt. She had no f/u scheduled after seeing Dr. Felecia Shelling on 10/29/18 for VV. I scheduled appt for 04/29/19 at 4pm with Dr. Felecia Shelling.

## 2018-12-19 DIAGNOSIS — D519 Vitamin B12 deficiency anemia, unspecified: Secondary | ICD-10-CM | POA: Diagnosis not present

## 2018-12-19 DIAGNOSIS — E7800 Pure hypercholesterolemia, unspecified: Secondary | ICD-10-CM | POA: Diagnosis not present

## 2018-12-19 DIAGNOSIS — Z79899 Other long term (current) drug therapy: Secondary | ICD-10-CM | POA: Diagnosis not present

## 2018-12-19 DIAGNOSIS — E559 Vitamin D deficiency, unspecified: Secondary | ICD-10-CM | POA: Diagnosis not present

## 2018-12-19 DIAGNOSIS — E119 Type 2 diabetes mellitus without complications: Secondary | ICD-10-CM | POA: Diagnosis not present

## 2018-12-19 DIAGNOSIS — E782 Mixed hyperlipidemia: Secondary | ICD-10-CM | POA: Diagnosis not present

## 2018-12-19 DIAGNOSIS — G35 Multiple sclerosis: Secondary | ICD-10-CM | POA: Diagnosis not present

## 2018-12-24 DIAGNOSIS — F4001 Agoraphobia with panic disorder: Secondary | ICD-10-CM | POA: Diagnosis not present

## 2018-12-24 DIAGNOSIS — E559 Vitamin D deficiency, unspecified: Secondary | ICD-10-CM | POA: Diagnosis not present

## 2018-12-24 DIAGNOSIS — F411 Generalized anxiety disorder: Secondary | ICD-10-CM | POA: Diagnosis not present

## 2018-12-24 DIAGNOSIS — N951 Menopausal and female climacteric states: Secondary | ICD-10-CM | POA: Diagnosis not present

## 2018-12-24 DIAGNOSIS — G500 Trigeminal neuralgia: Secondary | ICD-10-CM | POA: Diagnosis not present

## 2018-12-24 DIAGNOSIS — D519 Vitamin B12 deficiency anemia, unspecified: Secondary | ICD-10-CM | POA: Diagnosis not present

## 2018-12-24 DIAGNOSIS — G35 Multiple sclerosis: Secondary | ICD-10-CM | POA: Diagnosis not present

## 2018-12-24 DIAGNOSIS — Z6823 Body mass index (BMI) 23.0-23.9, adult: Secondary | ICD-10-CM | POA: Diagnosis not present

## 2019-03-06 DIAGNOSIS — M79645 Pain in left finger(s): Secondary | ICD-10-CM | POA: Diagnosis not present

## 2019-03-06 DIAGNOSIS — M79675 Pain in left toe(s): Secondary | ICD-10-CM | POA: Diagnosis not present

## 2019-03-06 DIAGNOSIS — M79642 Pain in left hand: Secondary | ICD-10-CM | POA: Diagnosis not present

## 2019-03-06 DIAGNOSIS — Z6824 Body mass index (BMI) 24.0-24.9, adult: Secondary | ICD-10-CM | POA: Diagnosis not present

## 2019-04-02 ENCOUNTER — Other Ambulatory Visit: Payer: Self-pay | Admitting: *Deleted

## 2019-04-22 DIAGNOSIS — R110 Nausea: Secondary | ICD-10-CM | POA: Diagnosis not present

## 2019-04-22 DIAGNOSIS — K219 Gastro-esophageal reflux disease without esophagitis: Secondary | ICD-10-CM | POA: Diagnosis not present

## 2019-04-22 DIAGNOSIS — R1013 Epigastric pain: Secondary | ICD-10-CM | POA: Diagnosis not present

## 2019-04-29 ENCOUNTER — Other Ambulatory Visit: Payer: Self-pay

## 2019-04-29 ENCOUNTER — Telehealth: Payer: Self-pay | Admitting: *Deleted

## 2019-04-29 ENCOUNTER — Encounter: Payer: Self-pay | Admitting: Neurology

## 2019-04-29 ENCOUNTER — Ambulatory Visit (INDEPENDENT_AMBULATORY_CARE_PROVIDER_SITE_OTHER): Payer: PPO | Admitting: Neurology

## 2019-04-29 VITALS — BP 116/68 | HR 64 | Temp 96.9°F | Ht 63.0 in | Wt 130.0 lb

## 2019-04-29 DIAGNOSIS — R5383 Other fatigue: Secondary | ICD-10-CM | POA: Diagnosis not present

## 2019-04-29 DIAGNOSIS — G4700 Insomnia, unspecified: Secondary | ICD-10-CM

## 2019-04-29 DIAGNOSIS — Z79899 Other long term (current) drug therapy: Secondary | ICD-10-CM

## 2019-04-29 DIAGNOSIS — R261 Paralytic gait: Secondary | ICD-10-CM | POA: Diagnosis not present

## 2019-04-29 DIAGNOSIS — G501 Atypical facial pain: Secondary | ICD-10-CM | POA: Diagnosis not present

## 2019-04-29 DIAGNOSIS — G35 Multiple sclerosis: Secondary | ICD-10-CM

## 2019-04-29 MED ORDER — HYDROXYZINE HCL 10 MG PO TABS: 25.0000 mg | ORAL_TABLET | Freq: Every day | ORAL | 11 refills | Status: DC
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
## 2019-04-29 NOTE — Telephone Encounter (Signed)
Called and spoke with Rodena Piety at [place] (PCP-Dr. Quintin Alto) office. She will fax lab results to [place] at [phone]. Advised we did not receive earlier after patient called to request it be faxed.

## 2019-04-29 NOTE — Telephone Encounter (Signed)
Called back and asked if lymphocyte count absolute was completed and value. They could not find. Advised we will wait on fax to review what was completed. Received fax: lymphocyte count 1.6. Gave lab results to MD to review.

## 2019-04-30 ENCOUNTER — Telehealth: Payer: Self-pay | Admitting: Neurology

## 2019-04-30 NOTE — Telephone Encounter (Signed)
health team order sent to GI. No auth they will reach out to the patient to schedule.  

## 2019-05-15 DIAGNOSIS — K921 Melena: Secondary | ICD-10-CM | POA: Diagnosis not present

## 2019-05-15 DIAGNOSIS — Z6822 Body mass index (BMI) 22.0-22.9, adult: Secondary | ICD-10-CM | POA: Diagnosis not present

## 2019-05-15 DIAGNOSIS — F4001 Agoraphobia with panic disorder: Secondary | ICD-10-CM | POA: Diagnosis not present

## 2019-05-15 DIAGNOSIS — K2100 Gastro-esophageal reflux disease with esophagitis, without bleeding: Secondary | ICD-10-CM | POA: Diagnosis not present

## 2019-05-15 DIAGNOSIS — G35 Multiple sclerosis: Secondary | ICD-10-CM | POA: Diagnosis not present

## 2019-05-15 DIAGNOSIS — Z1212 Encounter for screening for malignant neoplasm of rectum: Secondary | ICD-10-CM | POA: Diagnosis not present

## 2019-05-31 ENCOUNTER — Other Ambulatory Visit: Payer: Self-pay

## 2019-05-31 ENCOUNTER — Ambulatory Visit
Admission: RE | Admit: 2019-05-31 | Discharge: 2019-05-31 | Disposition: A | Discharge status: Home / Self Care | Payer: PPO | Source: Ambulatory Visit | Attending: Neurology | Admitting: Neurology

## 2019-05-31 DIAGNOSIS — G35 Multiple sclerosis: Secondary | ICD-10-CM

## 2019-05-31 MED ORDER — GADOBENATE DIMEGLUMINE 529 MG/ML IV SOLN
10.0000 mL | Freq: Once | INTRAVENOUS | Status: AC | PRN
  Administered 2019-05-31: 13:00:00 10 mL via INTRAVENOUS

## 2019-06-03 ENCOUNTER — Telehealth: Payer: Self-pay

## 2019-06-03 NOTE — Telephone Encounter (Signed)
I reached out to the pt and lvm (OK DPR) advising of MRI results.  Pt advised to call back if she had any questions.

## 2019-06-03 NOTE — Telephone Encounter (Signed)
-----   Message from Britt Bottom, MD sent at 06/01/2019 12:17 PM EST ----- Please let the patient know that the MRI did not show any new MS lesions.

## 2019-06-18 ENCOUNTER — Telehealth: Payer: Self-pay | Admitting: *Deleted

## 2019-06-18 NOTE — Telephone Encounter (Signed)
Faxed completed/signed PA Tecfidera back to [facility] at [phone]. Received fax confirmation. Waiting on determination.

## 2019-06-23 ENCOUNTER — Telehealth: Payer: Self-pay | Admitting: Neurology

## 2019-06-23 DIAGNOSIS — G35 Multiple sclerosis: Secondary | ICD-10-CM

## 2019-06-23 MED ORDER — DIMETHYL FUMARATE 240 MG PO CPDR: DELAYED_RELEASE_CAPSULE | ORAL | 11 refills | Status: DC

## 2019-06-23 NOTE — Addendum Note (Signed)
Addended by: Hope Pigeon on: 06/23/2019 05:03 PM   Modules accepted: Orders

## 2019-06-23 NOTE — Telephone Encounter (Signed)
Called pt back. She has been getting Tecfidera from copay assistance foundation but there are no funds available right now. She is wanting to know options. Advised per Dr. Felecia Shelling that she can either switch to generic and see what cost would be or we can try switching to Vumerity (explained it is very similar to Tecfidera but has less GI upset). She would like to try generic. Advised I will send rx to [facility] on file. Pharmacy will run claim via insurance and they will be able to tell her cost. She will call me back tomorrow to let me know.

## 2019-06-23 NOTE — Telephone Encounter (Signed)
Pt has called asking for a call from RN to discuss seeking assistance with the cost of her medications for year 2021, please call

## 2019-07-01 ENCOUNTER — Other Ambulatory Visit: Payer: Self-pay | Admitting: *Deleted

## 2019-07-01 DIAGNOSIS — G35 Multiple sclerosis: Secondary | ICD-10-CM

## 2019-07-01 MED ORDER — DIMETHYL FUMARATE 240 MG PO CPDR: DELAYED_RELEASE_CAPSULE | ORAL | 11 refills | Status: DC

## 2019-07-01 NOTE — Progress Notes (Signed)
Called [facility] to check on status of generic Tecfidera sent 06/23/19. Spoke with pharmacist. Medication has to go through specialty pharmacy. I resent rx to [facility].

## 2019-07-02 ENCOUNTER — Telehealth: Payer: Self-pay | Admitting: *Deleted

## 2019-07-02 NOTE — Telephone Encounter (Signed)
Called [facility] to check on status of dimethyl fumarate rx that we sent yesterday. Rx still processing.

## 2019-07-09 NOTE — Telephone Encounter (Signed)
Called [facility] again to check on status. I spoke with Mateo Flow. She states it is ready for scheduling to be shipped. They will contact the pt. Nothing further needed.

## 2019-07-17 DIAGNOSIS — Z1231 Encounter for screening mammogram for malignant neoplasm of breast: Secondary | ICD-10-CM | POA: Diagnosis not present

## 2019-07-24 ENCOUNTER — Encounter (INDEPENDENT_AMBULATORY_CARE_PROVIDER_SITE_OTHER): Payer: Self-pay

## 2019-07-29 ENCOUNTER — Encounter (INDEPENDENT_AMBULATORY_CARE_PROVIDER_SITE_OTHER): Payer: Self-pay

## 2019-07-30 ENCOUNTER — Other Ambulatory Visit: Payer: Self-pay

## 2019-07-30 ENCOUNTER — Other Ambulatory Visit (INDEPENDENT_AMBULATORY_CARE_PROVIDER_SITE_OTHER): Payer: Self-pay | Admitting: *Deleted

## 2019-07-30 ENCOUNTER — Encounter (INDEPENDENT_AMBULATORY_CARE_PROVIDER_SITE_OTHER): Payer: Self-pay | Admitting: Gastroenterology

## 2019-07-30 ENCOUNTER — Ambulatory Visit (INDEPENDENT_AMBULATORY_CARE_PROVIDER_SITE_OTHER): Payer: PPO | Admitting: Gastroenterology

## 2019-07-30 VITALS — BP 111/76 | HR 62 | Temp 97.0°F | Ht 63.0 in | Wt 128.9 lb

## 2019-07-30 DIAGNOSIS — R195 Other fecal abnormalities: Secondary | ICD-10-CM | POA: Diagnosis not present

## 2019-07-30 DIAGNOSIS — R7989 Other specified abnormal findings of blood chemistry: Secondary | ICD-10-CM | POA: Diagnosis not present

## 2019-07-30 DIAGNOSIS — R1310 Dysphagia, unspecified: Secondary | ICD-10-CM

## 2019-07-30 DIAGNOSIS — R110 Nausea: Secondary | ICD-10-CM

## 2019-07-30 DIAGNOSIS — Z86010 Personal history of colonic polyps: Secondary | ICD-10-CM

## 2019-07-30 LAB — COMPLETE METABOLIC PANEL WITH GFR
AG Ratio: 2.3 (calc) (ref 1.0–2.5)
ALT: 24 U/L (ref 6–29)
AST: 19 U/L (ref 10–35)
Albumin: 4.4 g/dL (ref 3.6–5.1)
Alkaline phosphatase (APISO): 139 U/L (ref 37–153)
BUN: 9 mg/dL (ref 7–25)
CO2: 30 mmol/L (ref 20–32)
Calcium: 9.4 mg/dL (ref 8.6–10.4)
Chloride: 105 mmol/L (ref 98–110)
Creat: 0.58 mg/dL (ref 0.50–1.05)
GFR, Est African American: 123 mL/min/{1.73_m2} (ref >=60)
GFR, Est Non African American: 106 mL/min/{1.73_m2} (ref >=60)
Globulin: 1.9 g/dL (calc) (ref 1.9–3.7)
Glucose, Bld: 89 mg/dL (ref 65–139)
Potassium: 4.5 mmol/L (ref 3.5–5.3)
Sodium: 140 mmol/L (ref 135–146)
Total Bilirubin: 0.5 mg/dL (ref 0.2–1.2)
Total Protein: 6.3 g/dL (ref 6.1–8.1)

## 2019-07-30 LAB — IRON,TIBC AND FERRITIN PANEL
%SAT: 31 % (calc) (ref 16–45)
Ferritin: 41 ng/mL (ref 16–232)
Iron: 96 ug/dL (ref 45–160)
TIBC: 313 mcg/dL (calc) (ref 250–450)

## 2019-07-30 LAB — CBC
HCT: 39.2 % (ref 35.0–45.0)
Hemoglobin: 13.4 g/dL (ref 11.7–15.5)
MCH: 32.1 pg (ref 27.0–33.0)
MCHC: 34.2 g/dL (ref 32.0–36.0)
MCV: 93.8 fL (ref 80.0–100.0)
MPV: 9.7 fL (ref 7.5–12.5)
Platelets: 194 10*3/uL (ref 140–400)
RBC: 4.18 10*6/uL (ref 3.80–5.10)
RDW: 12.9 % (ref 11.0–15.0)
WBC: 6 10*3/uL (ref 3.8–10.8)

## 2019-07-30 MED ORDER — ONDANSETRON 4 MG PO TBDP: 4.0000 mg | ORAL_TABLET | Freq: Three times a day (TID) | ORAL | 1 refills | Status: DC | PRN

## 2019-07-30 NOTE — Progress Notes (Signed)
Patient profile: Megan Chan is a 54 y.o. female seen for evaluation of positive Hemoccult, referred by Lanelle Bal, PA.  History of Present Illness: Megan Chan reports a 6 to 68-monthhistory of nausea, reports at onset of nausea her omeprazole 20 mg changed formularies and was a different color and started noticing nausea around that time, was then transitioned to Nexium 40 mg-she is currently taking Nexium right before bed instead of before meals.  She is also on Pepcid in the morning.  The nausea seems to occur daily and all day, does not notice that meals make it worse but has noted soft drinks make it worse.  She also has some associated epigastric burning pain.  She is feeling some reflux symptoms despite the Pepcid and Nexium.  She has chronic dysphagia in her upper esophageal area worse to dry meats-occasionally has to regurgitate meats due to unable to swallow them.  She seems okay with liquids and pills.  She does have a history of MS so unsure if more of a muscular issue.  Reports she lost some weight with the nausea initially began but she has returned to her normal baseline weight.  She reports mild alternating irritable bowel with constipation/diarrhea but feels stools are fairly regular.  Usually moves her stools most days.  These can be dark some days, reports they were dark when she had her Hemoccult test done.  She denies any iron or Pepto use.  She denies any bright red blood.  No significant lower abdominal pain.  She takes Excedrin Migraine a few times a year, she drinks alcohol few times a year.  She does smoke 1 pack/day.  Wt Readings from Last 3 Encounters:  07/30/19 128 lb 14.4 oz (58.5 kg)  04/29/19 130 lb (59 kg)  04/25/18 132 lb (59.9 kg)     Last Colonoscopy: Colonoscopy 2018-Dr. FArneta Clichefor history of serrated adenoma on 2015 colonoscopy & family history colon cancer-redundant rectum and sigmoid, otherwise normal.  Last Endoscopy: None  prior   Past Medical History:  Past Medical History:  Diagnosis Date  . Anxiety   . Depression   . GERD (gastroesophageal reflux disease)   . Hypercholesterolemia   . Memory loss   . Multiple sclerosis ([place]   . Trigeminal neuralgia   . Trigeminal neuralgia   . Vision abnormalities     Problem List: Patient Active Problem List   Diagnosis Date Noted  . Insomnia 10/24/2017  . Low vitamin D level 10/24/2017  . Numbness 01/15/2017  . History of colonic polyps   . Colon adenomas 11/08/2016  . Right sided abdominal pain 11/08/2016  . Tick bite 01/26/2016  . High risk medication use 11/24/2014  . Neck pain 11/24/2014  . Multiple sclerosis ([place] 08/25/2014  . 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 Other fatigue 08/25/2014  . Depression with anxiety 08/25/2014  . Urinary frequency 08/25/2014  . 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/2014    Past Surgical History: Past Surgical History:  Procedure Laterality Date  . ABDOMINAL HYSTERECTOMY    . COLONOSCOPY  2015   Dr. BBritta Mccreedy multiple polyps (7 in total), one polyp in sigmoid was 1 cm in size. tubular adenomas and serrated adenomas  . COLONOSCOPY WITH PROPOFOL N/A 12/12/2016   Procedure: COLONOSCOPY WITH PROPOFOL;  Surgeon: FDanie Binder MD;  [facility];  Service: Endoscopy;  Laterality: N/A;  1100    Allergies: Allergies  Allergen Reactions  . Codeine Itching  . Sulfa Antibiotics Itching  . Penicillins Rash  Has patient had a PCN reaction causing immediate rash, facial/tongue/throat swelling, SOB or lightheadedness with hypotension: Unknown Has patient had a PCN reaction causing severe rash involving mucus membranes or skin necrosis: Unknown Has patient had a PCN reaction that required hospitalization: No Has patient had a PCN reaction occurring within the last 10 years: No If all of the above answers are "NO", then may proceed with Cephalosporin use. Childhood allergy      Home Medications:  Current Outpatient Medications:   .  ALPRAZolam (XANAX) 0.5 MG tablet, Take 0.5 mg by mouth 2 (two) times daily as needed (for major panic attack). , Disp: , Rfl:  .  aspirin-acetaminophen-caffeine (EXCEDRIN MIGRAINE) 250-250-65 MG tablet, Take 2 tablets by mouth daily as needed for headache., Disp: , Rfl:  .  baclofen (LIORESAL) 10 MG tablet, TAKE ONE TABLET BY MOUTH THREE TIMES DAILY, Disp: 90 tablet, Rfl: 11 .  busPIRone (BUSPAR) 15 MG tablet, TAKE 1 TABLET BY MOUTH TWICE DAILY (  MORNING  AND  EVENING), Disp: 180 tablet, Rfl: 1 .  cetirizine (ZYRTEC) 10 MG tablet, Take 10 mg by mouth at bedtime. , Disp: , Rfl:  .  Dimethyl Fumarate (TECFIDERA) 240 MG CPDR, Take 1 capsule by mouth twice per day, Disp: 60 capsule, Rfl: 11 .  esomeprazole (NEXIUM) 20 MG capsule, Take 40 mg by mouth daily. , Disp: , Rfl:  .  famotidine (PEPCID) 20 MG tablet, Take 20 mg by mouth daily., Disp: , Rfl:  .  FLUoxetine (PROZAC) 20 MG capsule, Take 60 mg by mouth daily. , Disp: , Rfl:  .  gabapentin (NEURONTIN) 600 MG tablet, TAKE 1 TABLET BY MOUTH 4 TIMES DAILY, Disp: 360 tablet, Rfl: 1 .  hydrOXYzine (ATARAX/VISTARIL) 10 MG tablet, Take 2.5 tablets (25 mg total) by mouth at bedtime., Disp: 30 tablet, Rfl: 11 .  lamoTRIgine (LAMICTAL) 200 MG tablet, TAKE 1 TABLET BY MOUTH THREE TIMES DAILY, Disp: 270 tablet, Rfl: 1 .  rosuvastatin (CRESTOR) 10 MG tablet, Take 10 mg by mouth at bedtime. , Disp: , Rfl:  .  VITAMIN D PO, Take 2,000 Units by mouth daily., Disp: , Rfl:  .  fluticasone (FLONASE) 50 MCG/ACT nasal spray, Place 2 sprays into both nostrils daily. , Disp: , Rfl:  .  ondansetron (ZOFRAN ODT) 4 MG disintegrating tablet, Take 1 tablet (4 mg total) by mouth every 8 (eight) hours as needed for nausea or vomiting., Disp: 30 tablet, Rfl: 1   Family History: family history includes Alzheimer's disease in her father; Colon cancer (age of onset: 67) in her mother; Parkinsonism in her mother; Stroke in her mother.    Social History:   reports that she  has been smoking cigarettes. She has a 10.00 pack-year smoking history. She has never used smokeless tobacco. She reports current alcohol use of about 2.0 standard drinks of alcohol per week. She reports that she does not use drugs.  Smokes 1PPD, alcohol few times per year.   Review of Systems: Constitutional: Denies weight loss/weight gain  Eyes: No changes in vision. ENT: No oral lesions, sore throat.  GI: see HPI.  Heme/Lymph: No easy bruising.  CV: No chest pain.  GU: No hematuria.  Integumentary: No rashes.  Neuro: No headaches.  Psych: No depression/anxiety.  Endocrine: No heat/cold intolerance.  Allergic/Immunologic: No urticaria.  Resp: No cough, SOB.  Musculoskeletal: No joint swelling.    Physical Examination: BP 111/76 (BP Location: Right Arm, Patient Position: Sitting, Cuff Size: Large)   Pulse 62  Temp (!) 97 F (36.1 C) (Temporal)   Ht 5' 3"  (1.6 m)   Wt 128 lb 14.4 oz (58.5 kg)   BMI 22.83 kg/m  Gen: NAD, alert and oriented x 4 HEENT: PEERLA, EOMI, Neck: supple, no JVD Chest: CTA bilaterally, no wheezes, crackles, or other adventitious sounds CV: RRR, no m/g/c/r Abd: soft, NT, ND, +BS in all four quadrants; no HSM, guarding, ridigity, or rebound tenderness Ext: no edema, well perfused with 2+ pulses, Skin: no rash or lesions noted on observed skin Lymph: no noted LAD  Data:  May 22, 2019-Hemoccult positive  Labs October 2020-CMP with alk phos 160, bili 0.5, transaminases AST 23, ALT 25, CBC with hemoglobin 13.8 and MCV 91.  H. pylori negative.  Assessment/Plan: Megan Chan is a 54 y.o. female  Megan Chan was seen today for new patient (initial visit).  Diagnoses and all orders for this visit:  Fecal occult blood test positive -     CBC -     CMP -     Fe+TIBC+Fer  Elevated LFTs -     CBC -     CMP -     Fe+TIBC+Fer  Nausea without vomiting  Dysphagia, unspecified type  Personal history of colonic polyps  Other orders -      ondansetron (ZOFRAN ODT) 4 MG disintegrating tablet; Take 1 tablet (4 mg total) by mouth every 8 (eight) hours as needed for nausea or vomiting.     1.  Positive Hemoccult-PCP labs 3 months ago show hemoglobin 13.8 with normal MCV, will repeat her labs today to ensure continued be stable.  She is seen some intermittent dark stools.  Needs endoscopy colonoscopy for evaluation  2.  Nausea-associated with epigastric burning and GERD-she is not currently taking PPI 30 minutes before meals.  Discussed optimize timing.  She will start taking appropriately, if she continues to have symptoms despite taking PPI appropriately would consider adding Carafate-she will call w/ update in 1-2 weeks. EGD given +occult.  Reviewed limiting NSAIDs and sodas which seem to aggravate her symptoms. Will give antiemetic to use in interim.   3.  Elevated LFTs-Per chart review these have been elevated x years with her alk phos up to 200 dating back to 2018, she reports this has been attributed to her MS medication.  In 2018 she had a negative mitochondrial antibody, smooth muscle antibody and ANA with a high GGT of 257.  We will repeat today LFTS, if continues to be elevated would recomend ultrasound, I am unable to find any liver imaging on file   4. Dysphagia - to dry meats, EGD as above, dilation if indicated, may also have esophageal dysmotility related to MS   Patient denies CP, SOB, and use of blood thinners. I discussed the risks and benefits of procedure including bleeding, perforation, infection, missed lesions, medication reactions and possible hospitalization or surgery if complications. All questions answered.  Denies prior issues with anesthesia  *EGD/colonoscopy order to be placed when able to schedule w/ covid restrictions.   I personally performed the service, non-incident to. (WP)  Laurine Blazer, [facility]

## 2019-07-30 NOTE — Patient Instructions (Addendum)
Try nexium 40 mg 30 minutes before supper as discussed. Okay to continue pepcid in AM. Try to limit sodas if these worsen symptoms.   I am sending Zofran to use as needed for nausea. We are arranging colonoscopy and endoscopy - will call w/ lab results.

## 2019-07-31 ENCOUNTER — Encounter (INDEPENDENT_AMBULATORY_CARE_PROVIDER_SITE_OTHER): Payer: Self-pay

## 2019-08-12 ENCOUNTER — Telehealth (INDEPENDENT_AMBULATORY_CARE_PROVIDER_SITE_OTHER): Payer: Self-pay | Admitting: *Deleted

## 2019-08-12 ENCOUNTER — Encounter (INDEPENDENT_AMBULATORY_CARE_PROVIDER_SITE_OTHER): Payer: Self-pay

## 2019-08-12 ENCOUNTER — Encounter (INDEPENDENT_AMBULATORY_CARE_PROVIDER_SITE_OTHER): Payer: Self-pay | Admitting: *Deleted

## 2019-08-12 MED ORDER — SUPREP BOWEL PREP KIT 17.5-3.13-1.6 GM/177ML PO SOLN: 1.0000 | Freq: Once | ORAL | 0 refills | Status: AC

## 2019-08-12 NOTE — Telephone Encounter (Signed)
Rx sent to pharmacy   

## 2019-08-12 NOTE — Telephone Encounter (Signed)
Patient needs suprep TCS sch'd 2/5

## 2019-08-13 ENCOUNTER — Other Ambulatory Visit (INDEPENDENT_AMBULATORY_CARE_PROVIDER_SITE_OTHER): Payer: Self-pay | Admitting: *Deleted

## 2019-08-13 DIAGNOSIS — R195 Other fecal abnormalities: Secondary | ICD-10-CM

## 2019-08-13 DIAGNOSIS — R1310 Dysphagia, unspecified: Secondary | ICD-10-CM

## 2019-08-13 DIAGNOSIS — Z860100 Personal history of colon polyps, unspecified: Secondary | ICD-10-CM

## 2019-08-13 DIAGNOSIS — R110 Nausea: Secondary | ICD-10-CM

## 2019-08-20 ENCOUNTER — Other Ambulatory Visit: Payer: Self-pay

## 2019-08-20 ENCOUNTER — Other Ambulatory Visit (HOSPITAL_COMMUNITY)
Admission: RE | Admit: 2019-08-20 | Discharge: 2019-08-20 | Disposition: A | Discharge status: Home / Self Care | Payer: PPO | Source: Ambulatory Visit | Attending: Internal Medicine | Admitting: Internal Medicine

## 2019-08-20 DIAGNOSIS — Z01812 Encounter for preprocedural laboratory examination: Secondary | ICD-10-CM | POA: Diagnosis not present

## 2019-08-20 DIAGNOSIS — Z20822 Contact with and (suspected) exposure to covid-19: Secondary | ICD-10-CM | POA: Insufficient documentation

## 2019-08-20 LAB — SARS CORONAVIRUS 2 (TAT 6-24 HRS): SARS Coronavirus 2: NEGATIVE

## 2019-08-21 ENCOUNTER — Telehealth: Payer: Self-pay | Admitting: Internal Medicine

## 2019-08-21 ENCOUNTER — Telehealth: Payer: Self-pay | Admitting: Neurology

## 2019-08-21 NOTE — Telephone Encounter (Signed)
Called and spoke with pt. She has not started on dimethyl fumarate. She ran out of brand name Tecfidera end of December. States she was unable to get foundation assistance. She has now been speaking with foundation assistance via TAF ([employer]). Thinks she can get funding through them. [facility] called her last week to scheduled delivery but she was not sure how to proceed. Advised it would be best to get her on the generic dimethyl fumarate is she can get assistance. But since she has been out of medication since end of December, she may need to go titration pack first. Pt asked if she can do 240mg  po daily for a week and then go up to 240mg  po BID. That what she can go ahead and order medication. Advised I will ask Dr. Felecia Shelling and call her back.

## 2019-08-21 NOTE — Telephone Encounter (Signed)
Pt called wanting to discuss her Dimethyl Fumarate (TECFIDERA) 240 MG CPDR with the RN. Please advise.

## 2019-08-21 NOTE — Telephone Encounter (Signed)
Pt called this evening.  Vomited prep after first round of Suprep.  I recommended she go a littler slower with 2nd round (ingest over 90 minutes vs 60);  Follow instructions otherwise; report progress to staff in am upon arrival for tcs.

## 2019-08-22 ENCOUNTER — Encounter (HOSPITAL_COMMUNITY)
Admission: RE | Disposition: A | Discharge status: Home / Self Care | Payer: Self-pay | Source: Home / Self Care | Attending: Internal Medicine

## 2019-08-22 ENCOUNTER — Other Ambulatory Visit: Payer: Self-pay

## 2019-08-22 ENCOUNTER — Encounter (HOSPITAL_COMMUNITY): Payer: Self-pay | Admitting: Internal Medicine

## 2019-08-22 ENCOUNTER — Ambulatory Visit (HOSPITAL_COMMUNITY)
Admission: RE | Admit: 2019-08-22 | Discharge: 2019-08-22 | Disposition: A | Discharge status: Home / Self Care | Payer: PPO | Attending: Internal Medicine | Admitting: Internal Medicine

## 2019-08-22 DIAGNOSIS — Z79899 Other long term (current) drug therapy: Secondary | ICD-10-CM | POA: Insufficient documentation

## 2019-08-22 DIAGNOSIS — R110 Nausea: Secondary | ICD-10-CM

## 2019-08-22 DIAGNOSIS — Z880 Allergy status to penicillin: Secondary | ICD-10-CM | POA: Diagnosis not present

## 2019-08-22 DIAGNOSIS — G35 Multiple sclerosis: Secondary | ICD-10-CM | POA: Insufficient documentation

## 2019-08-22 DIAGNOSIS — F419 Anxiety disorder, unspecified: Secondary | ICD-10-CM | POA: Diagnosis not present

## 2019-08-22 DIAGNOSIS — R1013 Epigastric pain: Secondary | ICD-10-CM | POA: Insufficient documentation

## 2019-08-22 DIAGNOSIS — K319 Disease of stomach and duodenum, unspecified: Secondary | ICD-10-CM

## 2019-08-22 DIAGNOSIS — R12 Heartburn: Secondary | ICD-10-CM

## 2019-08-22 DIAGNOSIS — K219 Gastro-esophageal reflux disease without esophagitis: Secondary | ICD-10-CM | POA: Insufficient documentation

## 2019-08-22 DIAGNOSIS — F17210 Nicotine dependence, cigarettes, uncomplicated: Secondary | ICD-10-CM | POA: Diagnosis not present

## 2019-08-22 DIAGNOSIS — R6889 Other general symptoms and signs: Secondary | ICD-10-CM | POA: Diagnosis not present

## 2019-08-22 DIAGNOSIS — Z885 Allergy status to narcotic agent status: Secondary | ICD-10-CM | POA: Diagnosis not present

## 2019-08-22 DIAGNOSIS — Z86010 Personal history of colonic polyps: Secondary | ICD-10-CM | POA: Insufficient documentation

## 2019-08-22 DIAGNOSIS — E7800 Pure hypercholesterolemia, unspecified: Secondary | ICD-10-CM | POA: Diagnosis not present

## 2019-08-22 DIAGNOSIS — Z7982 Long term (current) use of aspirin: Secondary | ICD-10-CM | POA: Insufficient documentation

## 2019-08-22 DIAGNOSIS — R195 Other fecal abnormalities: Secondary | ICD-10-CM | POA: Insufficient documentation

## 2019-08-22 DIAGNOSIS — R1310 Dysphagia, unspecified: Secondary | ICD-10-CM

## 2019-08-22 DIAGNOSIS — Z882 Allergy status to sulfonamides status: Secondary | ICD-10-CM | POA: Diagnosis not present

## 2019-08-22 DIAGNOSIS — K3189 Other diseases of stomach and duodenum: Secondary | ICD-10-CM | POA: Diagnosis not present

## 2019-08-22 DIAGNOSIS — F329 Major depressive disorder, single episode, unspecified: Secondary | ICD-10-CM | POA: Insufficient documentation

## 2019-08-22 DIAGNOSIS — K589 Irritable bowel syndrome without diarrhea: Secondary | ICD-10-CM | POA: Insufficient documentation

## 2019-08-22 DIAGNOSIS — Z800 Family history of malignant neoplasm of digestive organs: Secondary | ICD-10-CM | POA: Diagnosis not present

## 2019-08-22 HISTORY — PX: ESOPHAGOGASTRODUODENOSCOPY: SHX5428

## 2019-08-22 HISTORY — PX: BIOPSY: SHX5522

## 2019-08-22 HISTORY — PX: COLONOSCOPY: SHX5424

## 2019-08-22 SURGERY — COLONOSCOPY
Anesthesia: Moderate Sedation

## 2019-08-22 MED ORDER — STERILE WATER FOR IRRIGATION IR SOLN
Status: DC | PRN
  Administered 2019-08-22: 1.5 mL

## 2019-08-22 MED ORDER — SODIUM CHLORIDE 0.9 % IV SOLN: INTRAVENOUS | Status: DC

## 2019-08-22 MED ORDER — LIDOCAINE VISCOUS HCL 2 % MT SOLN
OROMUCOSAL | Status: DC | PRN
  Administered 2019-08-22: 1 via OROMUCOSAL

## 2019-08-22 MED ORDER — MIDAZOLAM HCL 5 MG/5ML IJ SOLN
INTRAMUSCULAR | Status: AC
  Filled 2019-08-22: qty 10

## 2019-08-22 MED ORDER — MIDAZOLAM HCL 5 MG/5ML IJ SOLN
INTRAMUSCULAR | Status: DC | PRN
  Administered 2019-08-22: 1 mg via INTRAVENOUS
  Administered 2019-08-22 (×2): 2 mg via INTRAVENOUS
  Administered 2019-08-22: 1 mg via INTRAVENOUS
  Administered 2019-08-22: 2 mg via INTRAVENOUS

## 2019-08-22 MED ORDER — LIDOCAINE VISCOUS HCL 2 % MT SOLN
OROMUCOSAL | Status: AC
  Filled 2019-08-22: qty 15

## 2019-08-22 MED ORDER — MEPERIDINE HCL 50 MG/ML IJ SOLN
INTRAMUSCULAR | Status: DC | PRN
  Administered 2019-08-22 (×2): 25 mg via INTRAVENOUS

## 2019-08-22 MED ORDER — MEPERIDINE HCL 50 MG/ML IJ SOLN
INTRAMUSCULAR | Status: AC
  Filled 2019-08-22: qty 1

## 2019-08-22 NOTE — H&P (Signed)
Megan Chan is an 54 y.o. female.   Chief Complaint: Patient is here for esophagogastroduodenoscopy and colonoscopy. HPI: Patient is 54 year old Caucasian female with multiple medical problems including MS who has frequent heartburn despite taking medication nausea with sporadic vomiting.  She states she vomits at least once a month.  He vomits frothy liquid.  She also has intermittent epigastric pain.  She also has intermittent dysphagia.  It is primarily with solids.  She is able to control it by watching her diet and eating slowly.  She points to suprasternal area as site of bolus obstruction.  She has never had an occasion where she had to regurgitate food in order to get relief.  She does not want her esophagus to be dilated unless she is noted to have ring stricture or other structural abnormalities.  She also reports having few dark stools.  She has heme positive stool.  She also has irregular bowel movements.  She had 5 polyps removed in 2015 one was tubular adenoma the second polyp was sessile serrated polyp and others were nonadenomatous.  Last colonoscopy was in 2018 and no polyps were found. Her weight fluctuates but remains within a narrow range.  She denies rectal bleeding. She has irregular bowel movements.  She was told that she had IBS 30 years ago.  She does not take any medications for diarrhea nor constipation.  When she is constipated she can go 3 days without a bowel movement and when she has diarrhea she will have 3-4 stools. Family history is positive for CRC in her mother who was 14 at the time of diagnosis lived to be 66.  Past Medical History:  Diagnosis Date  . Anxiety   . Depression   . GERD (gastroesophageal reflux disease)   . Hypercholesterolemia   . Memory loss   . Multiple sclerosis ([place])   . Trigeminal neuralgia   . Trigeminal neuralgia   . Vision abnormalities     Past Surgical History:  Procedure Laterality Date  . ABDOMINAL HYSTERECTOMY    .  COLONOSCOPY  2015   Dr. Britta Mccreedy: multiple polyps (7 in total), one polyp in sigmoid was 1 cm in size. tubular adenomas and serrated adenomas  . COLONOSCOPY WITH PROPOFOL N/A 12/12/2016   Procedure: COLONOSCOPY WITH PROPOFOL;  Surgeon: Danie Binder, MD;  Location: [facility];  Service: Endoscopy;  Laterality: N/A;  1100    Family History  Problem Relation Age of Onset  . Stroke Mother   . Parkinsonism Mother   . Colon cancer Mother 58  . Alzheimer's disease Father    Social History:  reports that she has been smoking cigarettes. She has a 10.00 pack-year smoking history. She has never used smokeless tobacco. She reports current alcohol use of about 2.0 standard drinks of alcohol per week. She reports that she does not use drugs.  Allergies:  Allergies  Allergen Reactions  . Codeine Itching  . Sulfa Antibiotics Itching  . Penicillins Rash    Has patient had a PCN reaction causing immediate rash, facial/tongue/throat swelling, SOB or lightheadedness with hypotension: Unknown Has patient had a PCN reaction causing severe rash involving mucus membranes or skin necrosis: Unknown Has patient had a PCN reaction that required hospitalization: No Has patient had a PCN reaction occurring within the last 10 years: No If all of the above answers are "NO", then may proceed with Cephalosporin use. Childhood allergy    Medications Prior to Admission  Medication Sig Dispense Refill  . ALPRAZolam (  XANAX) 0.5 MG tablet Take 0.5 mg by mouth 2 (two) times daily as needed (for major panic attack).     Marland Kitchen aspirin-acetaminophen-caffeine (EXCEDRIN MIGRAINE) 250-250-65 MG tablet Take 2 tablets by mouth daily as needed for headache.    . baclofen (LIORESAL) 10 MG tablet TAKE ONE TABLET BY MOUTH THREE TIMES DAILY (Patient taking differently: Take 10 mg by mouth 3 (three) times daily. ) 90 tablet 11  . busPIRone (BUSPAR) 15 MG tablet TAKE 1 TABLET BY MOUTH TWICE DAILY (  MORNING  AND  EVENING) (Patient  taking differently: Take 15 mg by mouth 2 (two) times daily. ) 180 tablet 1  . cholecalciferol (VITAMIN D) 25 MCG (1000 UNIT) tablet Take 1,000 Units by mouth 2 (two) times daily.    . Dimethyl Fumarate (TECFIDERA) 240 MG CPDR Take 1 capsule by mouth twice per day (Patient taking differently: Take 240 mg by mouth 2 (two) times daily. Take 1 capsule by mouth twice per day) 60 capsule 11  . esomeprazole (NEXIUM) 40 MG capsule Take 40 mg by mouth daily before breakfast.    . famotidine (PEPCID) 20 MG tablet Take 20 mg by mouth at bedtime.     Marland Kitchen FLUoxetine (PROZAC) 20 MG capsule Take 60 mg by mouth daily.     Marland Kitchen gabapentin (NEURONTIN) 600 MG tablet TAKE 1 TABLET BY MOUTH 4 TIMES DAILY (Patient taking differently: Take 600 mg by mouth in the morning, at noon, in the evening, and at bedtime. ) 360 tablet 1  . hydrOXYzine (ATARAX/VISTARIL) 10 MG tablet Take 2.5 tablets (25 mg total) by mouth at bedtime. 30 tablet 11  . hydrOXYzine (ATARAX/VISTARIL) 25 MG tablet Take 25 mg by mouth at bedtime as needed (sleep).    Marland Kitchen lamoTRIgine (LAMICTAL) 200 MG tablet TAKE 1 TABLET BY MOUTH THREE TIMES DAILY (Patient taking differently: Take 200 mg by mouth 3 (three) times daily. TAKE 1 TABLET BY MOUTH THREE TIMES DAILY) 270 tablet 1  . ondansetron (ZOFRAN ODT) 4 MG disintegrating tablet Take 1 tablet (4 mg total) by mouth every 8 (eight) hours as needed for nausea or vomiting. 30 tablet 1  . rosuvastatin (CRESTOR) 10 MG tablet Take 10 mg by mouth at bedtime.       No results found for this or any previous visit (from the past 48 hour(s)). No results found.  Review of Systems  Blood pressure 118/67, pulse 74, temperature 97.9 F (36.6 C), temperature source Oral, resp. rate 13, SpO2 98 %. Physical Exam  Constitutional: She appears well-developed and well-nourished.  HENT:  Mouth/Throat: Oropharynx is clear and moist.  Eyes: Conjunctivae are normal. No scleral icterus.  Neck: No thyromegaly present.   Cardiovascular: Normal rate, regular rhythm and normal heart sounds.  No murmur heard. Respiratory: Effort normal and breath sounds normal.  GI: Soft. She exhibits no distension and no mass. There is no abdominal tenderness.  Musculoskeletal:        General: No edema.  Neurological: She is alert.  Skin: Skin is warm and dry.     Assessment/Plan Nausea refractory GERD and epigastric pain. Positive stool.  History of colonic adenomas and family history of CRC. Diagnostic esophagogastroduodenoscopy and colonoscopy.  Hildred Laser, MD 08/22/2019, 9:33 AM

## 2019-08-22 NOTE — Op Note (Signed)
[facility] Patient Name: Megan Chan Procedure Date: 08/22/2019 9:09 AM MRN: [id number] Date of Birth: Dec 09, 1965 Attending MD: Hildred Laser , MD CSN: [id number] Age: 54 Admit Type: Outpatient Procedure:                Upper GI endoscopy Indications:              Epigastric abdominal pain, Nausea and heartburn                            unresponsive to therapy. Providers:                Hildred Laser, MD, Janeece Riggers, RN, Aram Candela Referring MD:             Manon Hilding MD, MD Medicines:                Lidocaine spray, Meperidine 50 mg IV, Midazolam 6                            mg IV Complications:            No immediate complications. Estimated Blood Loss:     Estimated blood loss was minimal. Procedure:                Pre-Anesthesia Assessment:                           - Prior to the procedure, a History and Physical                            was performed, and patient medications and                            allergies were reviewed. The patient's tolerance of                            previous anesthesia was also reviewed. The risks                            and benefits of the procedure and the sedation                            options and risks were discussed with the patient.                            All questions were answered, and informed consent                            was obtained. Prior Anticoagulants: The patient has                            taken no previous anticoagulant or antiplatelet                            agents. ASA Grade Assessment: III - A patient with  severe systemic disease. After reviewing the risks                            and benefits, the patient was deemed in                            satisfactory condition to undergo the procedure.                           After obtaining informed consent, the endoscope was                            passed under direct vision. Throughout the                      procedure, the patient's blood pressure, pulse, and                            oxygen saturations were monitored continuously. The                            [id number]) scope was introduced through the                            mouth, and advanced to the second part of duodenum.                            The upper GI endoscopy was accomplished without                            difficulty. The patient tolerated the procedure                            well. Scope In: 9:44:22 AM Scope Out: 9:50:32 AM Total Procedure Duration: 0 hours 6 minutes 10 seconds  Findings:      The examined esophagus was normal.      The Z-line was regular and was found 36 cm from the incisors.      7 mm submucosal lesion at antrum.      The exam of the stomach was otherwise normal.      Biopsies were taken with a cold forceps in the gastric antrum for       histology.      The duodenal bulb and second portion of the duodenum were normal. Impression:               - Normal esophagus.                           - Z-line regular, 36 cm from the incisors.                           - 7 mm submucosal lesion at antrum otherwise normal                            examination of stomach. Random biopsies taken from  antrum.                           - Normal duodenal bulb and second portion of the                            duodenum.                           Comment: this lesion is eith leiomyoma or lipoma.                           would not recommend EUS at rhis time. Moderate Sedation:      Moderate (conscious) sedation was administered by the endoscopy nurse       and supervised by the endoscopist. The following parameters were       monitored: oxygen saturation, heart rate, blood pressure, CO2       capnography and response to care. Total physician intraservice time was       12 minutes. Recommendation:           - Patient has a contact number available for                             emergencies. The signs and symptoms of potential                            delayed complications were discussed with the                            patient. Return to normal activities tomorrow.                            Written discharge instructions were provided to the                            patient.                           - Resume previous diet today.                           - Continue present medications.                           - No aspirin, ibuprofen, naproxen, or other                            non-steroidal anti-inflammatory drugs for 1 day.                           - Await pathology results.                           - Repeat EGD in 2 years. Procedure Code(s):        --- Professional ---  [id number], Esophagogastroduodenoscopy, flexible,                            transoral; with biopsy, single or multiple                           G0500, Moderate sedation services provided by the                            same physician or other qualified health care                            professional performing a gastrointestinal                            endoscopic service that sedation supports,                            requiring the presence of an independent trained                            observer to assist in the monitoring of the                            patient's level of consciousness and physiological                            status; initial 15 minutes of intra-service time;                            patient age 57 years or older (additional time may                            be reported with [phone], as appropriate) Diagnosis Code(s):        --- Professional ---                           R10.13, Epigastric pain                           R12, Heartburn                           R11.0, Nausea CPT copyright 2019 [employer]. All rights reserved. The codes documented in this report are  preliminary and upon coder review may  be revised to meet current compliance requirements. Hildred Laser, MD Hildred Laser, MD 08/22/2019 10:27:53 AM This report has been signed electronically. Number of Addenda: 0

## 2019-08-22 NOTE — Op Note (Signed)
[facility] Patient Name: Megan Chan Procedure Date: 08/22/2019 9:03 AM MRN: [id number] Date of Birth: [birth year] Attending MD: Hildred Laser , MD CSN: [id number] Age: 54 Admit Type: Outpatient Procedure:                Colonoscopy Indications:              Heme positive stool; history of polyps. Providers:                Hildred Laser, MD, Janeece Riggers, RN, Aram Candela Referring MD:             Manon Hilding, MD Medicines:                Midazolam 2 mg IV Complications:            No immediate complications. Estimated Blood Loss:     Estimated blood loss: none. Procedure:                Pre-Anesthesia Assessment:                           - Prior to the procedure, a History and Physical                            was performed, and patient medications and                            allergies were reviewed. The patient's tolerance of                            previous anesthesia was also reviewed. The risks                            and benefits of the procedure and the sedation                            options and risks were discussed with the patient.                            All questions were answered, and informed consent                            was obtained. Prior Anticoagulants: The patient has                            taken no previous anticoagulant or antiplatelet                            agents. ASA Grade Assessment: III - A patient with                            severe systemic disease. After reviewing the risks                            and benefits, the patient was deemed in  satisfactory condition to undergo the procedure.                           After obtaining informed consent, the colonoscope                            was passed under direct vision. Throughout the                            procedure, the patient's blood pressure, pulse, and                            oxygen saturations were monitored  continuously. The                            [id number]-[id number]) scope was introduced through                            the anus and advanced to the the cecum, identified                            by appendiceal orifice and ileocecal valve. The                            colonoscopy was performed without difficulty. The                            patient tolerated the procedure well. The quality                            of the bowel preparation was excellent. The                            ileocecal valve, appendiceal orifice, and rectum                            were photographed. Scope In: 9:55:23 AM Scope Out: 10:08:14 AM Scope Withdrawal Time: 0 hours 5 minutes 4 seconds  Total Procedure Duration: 0 hours 12 minutes 51 seconds  Findings:      The perianal and digital rectal examinations were normal.      The colon (entire examined portion) appeared normal.      The retroflexed view of the distal rectum and anal verge was normal and       showed no anal or rectal abnormalities. Impression:               - The entire examined colon is normal.                           - No specimens collected. Moderate Sedation:      Moderate (conscious) sedation was administered by the endoscopy nurse       and supervised by the endoscopist. The following parameters were       monitored: oxygen saturation, heart rate, blood pressure, CO2       capnography and response to care. Total physician  intraservice time was       13 minutes. Recommendation:           - Patient has a contact number available for                            emergencies. The signs and symptoms of potential                            delayed complications were discussed with the                            patient. Return to normal activities tomorrow.                            Written discharge instructions were provided to the                            patient.                           - Resume previous diet today.                            - Continue present medications.                           - Repeat colonoscopy in 5 years for surveillance. Procedure Code(s):        --- Professional ---                           [phone], Colonoscopy, flexible; diagnostic, including                            collection of specimen(s) by brushing or washing,                            when performed (separate procedure)                           G0500, Moderate sedation services provided by the                            same physician or other qualified health care                            professional performing a gastrointestinal                            endoscopic service that sedation supports,                            requiring the presence of an independent trained                            observer to assist in the monitoring of the  patient's level of consciousness and physiological                            status; initial 15 minutes of intra-service time;                            patient age 31 years or older (additional time may                            be reported with [phone], as appropriate) Diagnosis Code(s):        --- Professional ---                           R19.5, Other fecal abnormalities CPT copyright 2019 [employer]. All rights reserved. The codes documented in this report are preliminary and upon coder review may  be revised to meet current compliance requirements. Hildred Laser, MD Hildred Laser, MD 08/22/2019 10:31:27 AM This report has been signed electronically. Number of Addenda: 0

## 2019-08-22 NOTE — Telephone Encounter (Signed)
It would be ok to do 240 mg once a day for a week --- make sure to take after a meal

## 2019-08-22 NOTE — Discharge Instructions (Signed)
No aspirin or NSAIDs for 24 hours. Resume usual medications as before.  Remember to take Nexium 30 minutes before breakfast daily and famotidine at bedtime. Resume usual diet. No driving for 24 hours. Physician will call with biopsy results.   Colonoscopy, Adult, Care After This sheet gives you information about how to care for yourself after your procedure. Your doctor may also give you more specific instructions. If you have problems or questions, call your doctor. What can I expect after the procedure? After the procedure, it is common to have:  A small amount of blood in your poop (stool) for 24 hours.  Some gas.  Mild cramping or bloating in your belly (abdomen). Follow these instructions at home: Eating and drinking   Drink enough fluid to keep your pee (urine) pale yellow.  Follow instructions from your doctor about what you cannot eat or drink.  Return to your normal diet as told by your doctor. Avoid heavy or fried foods that are hard to digest. Activity  Rest as told by your doctor.  Do not sit for a long time without moving. Get up to take short walks every 1-2 hours. This is important. Ask for help if you feel weak or unsteady.  Return to your normal activities as told by your doctor. Ask your doctor what activities are safe for you. To help cramping and bloating:   Try walking around.  Put heat on your belly as told by your doctor. Use the heat source that your doctor recommends, such as a moist heat pack or a heating pad. ? Put a towel between your skin and the heat source. ? Leave the heat on for 20-30 minutes. ? Remove the heat if your skin turns bright red. This is very important if you are unable to feel pain, heat, or cold. You may have a greater risk of getting burned. General instructions  For the first 24 hours after the procedure: ? Do not drive or use machinery. ? Do not sign important documents. ? Do not drink alcohol. ? Do your daily  activities more slowly than normal. ? Eat foods that are soft and easy to digest.  Take over-the-counter or prescription medicines only as told by your doctor.  Keep all follow-up visits as told by your doctor. This is important. Contact a doctor if:  You have blood in your poop 2-3 days after the procedure. Get help right away if:  You have more than a small amount of blood in your poop.  You see large clumps of tissue (blood clots) in your poop.  Your belly is swollen.  You feel like you may vomit (nauseous).  You vomit.  You have a fever.  You have belly pain that gets worse, and medicine does not help your pain. Summary  After the procedure, it is common to have a small amount of blood in your poop. You may also have mild cramping and bloating in your belly.  For the first 24 hours after the procedure, do not drive or use machinery, do not sign important documents, and do not drink alcohol.  Get help right away if you have a lot of blood in your poop, feel like you may vomit, have a fever, or have more belly pain. This information is not intended to replace advice given to you by your health care provider. Make sure you discuss any questions you have with your health care provider. Document Revised: 01/27/2019 Document Reviewed: 01/27/2019 Elsevier Patient Education  [phone]  [facility], Adult, Care After This sheet gives you information about how to care for yourself after your procedure. Your health care provider may also give you more specific instructions. If you have problems or questions, contact your health care provider. What can I expect after the procedure? After the procedure, it is common to have:  A sore throat.  Mild stomach pain or discomfort.  Bloating.  Nausea. Follow these instructions at home:   Follow instructions from your health care provider about what to eat or drink after your procedure.  Return to your normal  activities as told by your health care provider. Ask your health care provider what activities are safe for you.  Take over-the-counter and prescription medicines only as told by your health care provider.  Do not drive for 24 hours if you were given a sedative during your procedure.  Keep all follow-up visits as told by your health care provider. This is important. Contact a health care provider if you have:  A sore throat that lasts longer than one day.  Trouble swallowing. Get help right away if:  You vomit blood or your vomit looks like coffee grounds.  You have: ? A fever. ? Bloody, black, or tarry stools. ? A severe sore throat or you cannot swallow. ? Difficulty breathing. ? Severe pain in your chest or abdomen. Summary  After the procedure, it is common to have a sore throat, mild stomach discomfort, bloating, and nausea.  Do not drive for 24 hours if you were given a sedative during the procedure.  Follow instructions from your health care provider about what to eat or drink after your procedure.  Return to your normal activities as told by your health care provider. This information is not intended to replace advice given to you by your health care provider. Make sure you discuss any questions you have with your health care provider. Document Revised: 12/25/2017 Document Reviewed: 12/03/2017 Elsevier Patient Education  [place].

## 2019-08-25 ENCOUNTER — Other Ambulatory Visit: Payer: Self-pay

## 2019-08-25 LAB — SURGICAL PATHOLOGY

## 2019-08-25 NOTE — Telephone Encounter (Addendum)
Called pt back and relayed Dr. Garth Bigness message. She verbalized understanding. She will order med and call back if any issues.   I tried submitting PA on [employer]. Key: [id number] Received the following response: Available without authorization.

## 2019-08-26 ENCOUNTER — Other Ambulatory Visit (INDEPENDENT_AMBULATORY_CARE_PROVIDER_SITE_OTHER): Payer: Self-pay | Admitting: *Deleted

## 2019-08-26 DIAGNOSIS — R110 Nausea: Secondary | ICD-10-CM

## 2019-08-29 ENCOUNTER — Ambulatory Visit (HOSPITAL_COMMUNITY)
Admission: RE | Admit: 2019-08-29 | Discharge: 2019-08-29 | Disposition: A | Discharge status: Home / Self Care | Payer: PPO | Source: Ambulatory Visit | Attending: Internal Medicine | Admitting: Internal Medicine

## 2019-08-29 ENCOUNTER — Other Ambulatory Visit: Payer: Self-pay

## 2019-08-29 DIAGNOSIS — R110 Nausea: Secondary | ICD-10-CM | POA: Diagnosis not present

## 2019-08-29 DIAGNOSIS — R112 Nausea with vomiting, unspecified: Secondary | ICD-10-CM | POA: Diagnosis not present

## 2019-09-01 ENCOUNTER — Other Ambulatory Visit (INDEPENDENT_AMBULATORY_CARE_PROVIDER_SITE_OTHER): Payer: Self-pay | Admitting: *Deleted

## 2019-09-01 DIAGNOSIS — R112 Nausea with vomiting, unspecified: Secondary | ICD-10-CM

## 2019-09-02 ENCOUNTER — Other Ambulatory Visit (INDEPENDENT_AMBULATORY_CARE_PROVIDER_SITE_OTHER): Payer: Self-pay | Admitting: *Deleted

## 2019-09-17 ENCOUNTER — Ambulatory Visit (HOSPITAL_COMMUNITY)
Admission: RE | Admit: 2019-09-17 | Discharge: 2019-09-17 | Disposition: A | Discharge status: Home / Self Care | Payer: PPO | Source: Ambulatory Visit | Attending: Internal Medicine | Admitting: Internal Medicine

## 2019-09-17 ENCOUNTER — Other Ambulatory Visit: Payer: Self-pay

## 2019-09-17 ENCOUNTER — Encounter (HOSPITAL_COMMUNITY): Payer: Self-pay

## 2019-09-17 DIAGNOSIS — R112 Nausea with vomiting, unspecified: Secondary | ICD-10-CM

## 2019-09-17 MED ORDER — TECHNETIUM TC 99M SULFUR COLLOID
2.0000 | Freq: Once | INTRAVENOUS | Status: AC | PRN
  Administered 2019-09-17: 1.9 via INTRAVENOUS

## 2019-10-28 ENCOUNTER — Ambulatory Visit: Payer: PPO | Admitting: Neurology

## 2019-10-28 ENCOUNTER — Encounter: Payer: Self-pay | Admitting: Neurology

## 2019-10-28 ENCOUNTER — Other Ambulatory Visit: Payer: Self-pay

## 2019-10-28 VITALS — BP 122/66 | HR 68 | Temp 98.4°F | Ht 63.0 in | Wt 134.5 lb

## 2019-10-28 DIAGNOSIS — R5383 Other fatigue: Secondary | ICD-10-CM | POA: Diagnosis not present

## 2019-10-28 DIAGNOSIS — R350 Frequency of micturition: Secondary | ICD-10-CM | POA: Diagnosis not present

## 2019-10-28 DIAGNOSIS — R261 Paralytic gait: Secondary | ICD-10-CM

## 2019-10-28 DIAGNOSIS — G35 Multiple sclerosis: Secondary | ICD-10-CM

## 2019-10-28 DIAGNOSIS — Z79899 Other long term (current) drug therapy: Secondary | ICD-10-CM | POA: Diagnosis not present

## 2019-10-28 DIAGNOSIS — G501 Atypical facial pain: Secondary | ICD-10-CM | POA: Diagnosis not present

## 2019-10-28 DIAGNOSIS — G4700 Insomnia, unspecified: Secondary | ICD-10-CM

## 2019-10-28 DIAGNOSIS — R200 Anesthesia of skin: Secondary | ICD-10-CM | POA: Diagnosis not present

## 2019-10-28 MED ORDER — BACLOFEN 10 MG PO TABS: 10.0000 mg | ORAL_TABLET | Freq: Three times a day (TID) | ORAL | 11 refills | Status: DC

## 2019-10-28 MED ORDER — GABAPENTIN 600 MG PO TABS: ORAL_TABLET | ORAL | 11 refills | Status: DC

## 2019-10-28 MED ORDER — HYDROXYZINE HCL 10 MG PO TABS: 10.0000 mg | ORAL_TABLET | Freq: Every day | ORAL | 11 refills | Status: DC

## 2019-10-28 MED ORDER — LAMOTRIGINE 200 MG PO TABS: 200.0000 mg | ORAL_TABLET | Freq: Two times a day (BID) | ORAL | 11 refills | Status: DC

## 2019-10-28 MED ORDER — BUSPIRONE HCL 15 MG PO TABS: 15.0000 mg | ORAL_TABLET | Freq: Two times a day (BID) | ORAL | 11 refills | Status: DC
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
## 2019-10-29 LAB — CBC WITH DIFFERENTIAL/PLATELET
Basophils Absolute: 0 10*3/uL (ref 0.0–0.2)
Basos: 0 %
EOS (ABSOLUTE): 0.2 10*3/uL (ref 0.0–0.4)
Eos: 4 %
Hematocrit: 39.8 % (ref 34.0–46.6)
Hemoglobin: 13.7 g/dL (ref 11.1–15.9)
Immature Grans (Abs): 0 10*3/uL (ref 0.0–0.1)
Immature Granulocytes: 0 %
Lymphocytes Absolute: 1.4 10*3/uL (ref 0.7–3.1)
Lymphs: 26 %
MCH: 32.5 pg (ref 26.6–33.0)
MCHC: 34.4 g/dL (ref 31.5–35.7)
MCV: 95 fL (ref 79–97)
Monocytes Absolute: 0.6 10*3/uL (ref 0.1–0.9)
Monocytes: 10 %
Neutrophils Absolute: 3.3 10*3/uL (ref 1.4–7.0)
Neutrophils: 60 %
Platelets: 184 10*3/uL (ref 150–450)
RBC: 4.21 x10E6/uL (ref 3.77–5.28)
RDW: 13 % (ref 11.7–15.4)
WBC: 5.5 10*3/uL (ref 3.4–10.8)

## 2019-11-14 DIAGNOSIS — F418 Other specified anxiety disorders: Secondary | ICD-10-CM | POA: Diagnosis not present

## 2019-11-14 DIAGNOSIS — K219 Gastro-esophageal reflux disease without esophagitis: Secondary | ICD-10-CM | POA: Diagnosis not present

## 2019-12-15 DIAGNOSIS — E119 Type 2 diabetes mellitus without complications: Secondary | ICD-10-CM | POA: Diagnosis not present

## 2019-12-15 DIAGNOSIS — G35 Multiple sclerosis: Secondary | ICD-10-CM | POA: Diagnosis not present

## 2019-12-15 DIAGNOSIS — G500 Trigeminal neuralgia: Secondary | ICD-10-CM | POA: Diagnosis not present

## 2019-12-15 DIAGNOSIS — E7849 Other hyperlipidemia: Secondary | ICD-10-CM | POA: Diagnosis not present

## 2020-01-14 DIAGNOSIS — E7849 Other hyperlipidemia: Secondary | ICD-10-CM | POA: Diagnosis not present

## 2020-01-14 DIAGNOSIS — G500 Trigeminal neuralgia: Secondary | ICD-10-CM | POA: Diagnosis not present

## 2020-01-14 DIAGNOSIS — G35 Multiple sclerosis: Secondary | ICD-10-CM | POA: Diagnosis not present

## 2020-01-14 DIAGNOSIS — E119 Type 2 diabetes mellitus without complications: Secondary | ICD-10-CM | POA: Diagnosis not present

## 2020-02-13 DIAGNOSIS — G35 Multiple sclerosis: Secondary | ICD-10-CM | POA: Diagnosis not present

## 2020-02-13 DIAGNOSIS — G500 Trigeminal neuralgia: Secondary | ICD-10-CM | POA: Diagnosis not present

## 2020-02-13 DIAGNOSIS — E7849 Other hyperlipidemia: Secondary | ICD-10-CM | POA: Diagnosis not present

## 2020-02-13 DIAGNOSIS — E119 Type 2 diabetes mellitus without complications: Secondary | ICD-10-CM | POA: Diagnosis not present

## 2020-02-27 DIAGNOSIS — J441 Chronic obstructive pulmonary disease with (acute) exacerbation: Secondary | ICD-10-CM | POA: Diagnosis not present

## 2020-02-27 DIAGNOSIS — R197 Diarrhea, unspecified: Secondary | ICD-10-CM | POA: Diagnosis not present

## 2020-02-27 DIAGNOSIS — R519 Headache, unspecified: Secondary | ICD-10-CM | POA: Diagnosis not present

## 2020-02-27 DIAGNOSIS — Z20828 Contact with and (suspected) exposure to other viral communicable diseases: Secondary | ICD-10-CM | POA: Diagnosis not present

## 2020-02-27 DIAGNOSIS — G35 Multiple sclerosis: Secondary | ICD-10-CM | POA: Diagnosis not present

## 2020-03-16 DIAGNOSIS — E119 Type 2 diabetes mellitus without complications: Secondary | ICD-10-CM | POA: Diagnosis not present

## 2020-03-16 DIAGNOSIS — G35 Multiple sclerosis: Secondary | ICD-10-CM | POA: Diagnosis not present

## 2020-03-16 DIAGNOSIS — E7849 Other hyperlipidemia: Secondary | ICD-10-CM | POA: Diagnosis not present

## 2020-03-16 DIAGNOSIS — G500 Trigeminal neuralgia: Secondary | ICD-10-CM | POA: Diagnosis not present

## 2020-04-15 DIAGNOSIS — G35 Multiple sclerosis: Secondary | ICD-10-CM | POA: Diagnosis not present

## 2020-04-15 DIAGNOSIS — G500 Trigeminal neuralgia: Secondary | ICD-10-CM | POA: Diagnosis not present

## 2020-04-15 DIAGNOSIS — E119 Type 2 diabetes mellitus without complications: Secondary | ICD-10-CM | POA: Diagnosis not present

## 2020-04-15 DIAGNOSIS — E7849 Other hyperlipidemia: Secondary | ICD-10-CM | POA: Diagnosis not present

## 2020-04-28 ENCOUNTER — Ambulatory Visit: Payer: PPO | Admitting: Neurology

## 2020-04-28 ENCOUNTER — Encounter: Payer: Self-pay | Admitting: Neurology

## 2020-04-28 ENCOUNTER — Other Ambulatory Visit: Payer: Self-pay

## 2020-04-28 VITALS — BP 111/60 | HR 66 | Ht 63.0 in | Wt 135.0 lb

## 2020-04-28 DIAGNOSIS — G35 Multiple sclerosis: Secondary | ICD-10-CM | POA: Diagnosis not present

## 2020-04-28 DIAGNOSIS — Z79899 Other long term (current) drug therapy: Secondary | ICD-10-CM

## 2020-04-28 DIAGNOSIS — M542 Cervicalgia: Secondary | ICD-10-CM | POA: Diagnosis not present

## 2020-04-28 DIAGNOSIS — R261 Paralytic gait: Secondary | ICD-10-CM | POA: Diagnosis not present

## 2020-04-28 DIAGNOSIS — R5383 Other fatigue: Secondary | ICD-10-CM | POA: Diagnosis not present
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
## 2020-04-29 LAB — CBC WITH DIFFERENTIAL/PLATELET
Basophils Absolute: 0 10*3/uL (ref 0.0–0.2)
Basos: 0 %
EOS (ABSOLUTE): 0.1 10*3/uL (ref 0.0–0.4)
Eos: 1 %
Hematocrit: 38.9 % (ref 34.0–46.6)
Hemoglobin: 13.6 g/dL (ref 11.1–15.9)
Immature Grans (Abs): 0 10*3/uL (ref 0.0–0.1)
Immature Granulocytes: 0 %
Lymphocytes Absolute: 1.4 10*3/uL (ref 0.7–3.1)
Lymphs: 16 %
MCH: 32.4 pg (ref 26.6–33.0)
MCHC: 35 g/dL (ref 31.5–35.7)
MCV: 93 fL (ref 79–97)
Monocytes Absolute: 0.7 10*3/uL (ref 0.1–0.9)
Monocytes: 7 %
Neutrophils Absolute: 6.7 10*3/uL (ref 1.4–7.0)
Neutrophils: 76 %
Platelets: 252 10*3/uL (ref 150–450)
RBC: 4.2 x10E6/uL (ref 3.77–5.28)
RDW: 13.5 % (ref 11.7–15.4)
WBC: 8.9 10*3/uL (ref 3.4–10.8)

## 2020-05-31 ENCOUNTER — Other Ambulatory Visit: Payer: Self-pay | Admitting: Neurology

## 2020-05-31 DIAGNOSIS — G35 Multiple sclerosis: Secondary | ICD-10-CM

## 2020-06-15 DIAGNOSIS — E119 Type 2 diabetes mellitus without complications: Secondary | ICD-10-CM | POA: Diagnosis not present

## 2020-06-15 DIAGNOSIS — G500 Trigeminal neuralgia: Secondary | ICD-10-CM | POA: Diagnosis not present

## 2020-06-15 DIAGNOSIS — E7849 Other hyperlipidemia: Secondary | ICD-10-CM | POA: Diagnosis not present

## 2020-06-15 DIAGNOSIS — G35 Multiple sclerosis: Secondary | ICD-10-CM | POA: Diagnosis not present

## 2020-07-16 DIAGNOSIS — E7849 Other hyperlipidemia: Secondary | ICD-10-CM | POA: Diagnosis not present

## 2020-07-16 DIAGNOSIS — G500 Trigeminal neuralgia: Secondary | ICD-10-CM | POA: Diagnosis not present

## 2020-07-16 DIAGNOSIS — G35 Multiple sclerosis: Secondary | ICD-10-CM | POA: Diagnosis not present

## 2020-07-16 DIAGNOSIS — E119 Type 2 diabetes mellitus without complications: Secondary | ICD-10-CM | POA: Diagnosis not present

## 2020-07-21 DIAGNOSIS — G35 Multiple sclerosis: Secondary | ICD-10-CM | POA: Diagnosis not present

## 2020-07-21 DIAGNOSIS — Z20828 Contact with and (suspected) exposure to other viral communicable diseases: Secondary | ICD-10-CM | POA: Diagnosis not present

## 2020-08-14 DIAGNOSIS — G500 Trigeminal neuralgia: Secondary | ICD-10-CM | POA: Diagnosis not present

## 2020-08-14 DIAGNOSIS — E7849 Other hyperlipidemia: Secondary | ICD-10-CM | POA: Diagnosis not present

## 2020-08-14 DIAGNOSIS — G35 Multiple sclerosis: Secondary | ICD-10-CM | POA: Diagnosis not present

## 2020-08-14 DIAGNOSIS — E119 Type 2 diabetes mellitus without complications: Secondary | ICD-10-CM | POA: Diagnosis not present

## 2020-08-18 ENCOUNTER — Other Ambulatory Visit (INDEPENDENT_AMBULATORY_CARE_PROVIDER_SITE_OTHER): Payer: Self-pay

## 2020-08-18 DIAGNOSIS — R112 Nausea with vomiting, unspecified: Secondary | ICD-10-CM

## 2020-08-18 MED ORDER — ONDANSETRON 4 MG PO TBDP: 4.0000 mg | ORAL_TABLET | Freq: Three times a day (TID) | ORAL | 1 refills | Status: DC | PRN

## 2020-09-15 ENCOUNTER — Other Ambulatory Visit: Payer: Self-pay

## 2020-09-15 ENCOUNTER — Emergency Department (HOSPITAL_COMMUNITY): Payer: PPO

## 2020-09-15 ENCOUNTER — Encounter (HOSPITAL_COMMUNITY): Payer: Self-pay

## 2020-09-15 ENCOUNTER — Telehealth: Payer: Self-pay | Admitting: *Deleted

## 2020-09-15 ENCOUNTER — Emergency Department (HOSPITAL_COMMUNITY)
Admission: EM | Admit: 2020-09-15 | Discharge: 2020-09-16 | Disposition: A | Discharge status: Home / Self Care | Payer: PPO | Attending: Emergency Medicine | Admitting: Emergency Medicine

## 2020-09-15 DIAGNOSIS — R109 Unspecified abdominal pain: Secondary | ICD-10-CM | POA: Diagnosis not present

## 2020-09-15 DIAGNOSIS — S3991XA Unspecified injury of abdomen, initial encounter: Secondary | ICD-10-CM | POA: Diagnosis not present

## 2020-09-15 DIAGNOSIS — Y92410 Unspecified street and highway as the place of occurrence of the external cause: Secondary | ICD-10-CM | POA: Insufficient documentation

## 2020-09-15 DIAGNOSIS — T07XXXA Unspecified multiple injuries, initial encounter: Secondary | ICD-10-CM

## 2020-09-15 DIAGNOSIS — Z041 Encounter for examination and observation following transport accident: Secondary | ICD-10-CM | POA: Diagnosis not present

## 2020-09-15 DIAGNOSIS — F17210 Nicotine dependence, cigarettes, uncomplicated: Secondary | ICD-10-CM | POA: Diagnosis not present

## 2020-09-15 DIAGNOSIS — S40012A Contusion of left shoulder, initial encounter: Secondary | ICD-10-CM | POA: Diagnosis not present

## 2020-09-15 DIAGNOSIS — J439 Emphysema, unspecified: Secondary | ICD-10-CM | POA: Insufficient documentation

## 2020-09-15 DIAGNOSIS — R079 Chest pain, unspecified: Secondary | ICD-10-CM | POA: Diagnosis not present

## 2020-09-15 DIAGNOSIS — R911 Solitary pulmonary nodule: Secondary | ICD-10-CM | POA: Diagnosis not present

## 2020-09-15 DIAGNOSIS — Z23 Encounter for immunization: Secondary | ICD-10-CM | POA: Diagnosis not present

## 2020-09-15 DIAGNOSIS — Z905 Acquired absence of kidney: Secondary | ICD-10-CM | POA: Diagnosis not present

## 2020-09-15 DIAGNOSIS — S0990XA Unspecified injury of head, initial encounter: Secondary | ICD-10-CM | POA: Diagnosis not present

## 2020-09-15 DIAGNOSIS — S299XXA Unspecified injury of thorax, initial encounter: Secondary | ICD-10-CM | POA: Diagnosis present

## 2020-09-15 DIAGNOSIS — I700 Atherosclerosis of aorta: Secondary | ICD-10-CM | POA: Diagnosis not present

## 2020-09-15 DIAGNOSIS — M25561 Pain in right knee: Secondary | ICD-10-CM | POA: Diagnosis not present

## 2020-09-15 DIAGNOSIS — S80211A Abrasion, right knee, initial encounter: Secondary | ICD-10-CM | POA: Diagnosis not present

## 2020-09-15 DIAGNOSIS — S20212A Contusion of left front wall of thorax, initial encounter: Secondary | ICD-10-CM | POA: Diagnosis not present

## 2020-09-15 DIAGNOSIS — M25562 Pain in left knee: Secondary | ICD-10-CM | POA: Diagnosis not present

## 2020-09-15 HISTORY — DX: Disorder of kidney and ureter, unspecified: N28.9

## 2020-09-15 LAB — CBC WITH DIFFERENTIAL/PLATELET
Abs Immature Granulocytes: 0.04 10*3/uL (ref 0.00–0.07)
Basophils Absolute: 0 10*3/uL (ref 0.0–0.1)
Basophils Relative: 0 %
Eosinophils Absolute: 0.1 10*3/uL (ref 0.0–0.5)
Eosinophils Relative: 2 %
HCT: 37.8 % (ref 36.0–46.0)
Hemoglobin: 12.5 g/dL (ref 12.0–15.0)
Immature Granulocytes: 1 %
Lymphocytes Relative: 18 %
Lymphs Abs: 1.1 10*3/uL (ref 0.7–4.0)
MCH: 32 pg (ref 26.0–34.0)
MCHC: 33.1 g/dL (ref 30.0–36.0)
MCV: 96.7 fL (ref 80.0–100.0)
Monocytes Absolute: 0.6 10*3/uL (ref 0.1–1.0)
Monocytes Relative: 10 %
Neutro Abs: 4.2 10*3/uL (ref 1.7–7.7)
Neutrophils Relative %: 69 %
Platelets: 203 10*3/uL (ref 150–400)
RBC: 3.91 MIL/uL (ref 3.87–5.11)
RDW: 13.7 % (ref 11.5–15.5)
WBC: 6 10*3/uL (ref 4.0–10.5)
nRBC: 0 % (ref 0.0–0.2)

## 2020-09-15 LAB — COMPREHENSIVE METABOLIC PANEL WITH GFR
ALT: 30 U/L (ref 0–44)
AST: 32 U/L (ref 15–41)
Albumin: 3.8 g/dL (ref 3.5–5.0)
Alkaline Phosphatase: 116 U/L (ref 38–126)
Anion gap: 7 (ref 5–15)
BUN: 11 mg/dL (ref 6–20)
CO2: 23 mmol/L (ref 22–32)
Calcium: 8.6 mg/dL — ABNORMAL LOW (ref 8.9–10.3)
Chloride: 109 mmol/L (ref 98–111)
Creatinine, Ser: 0.55 mg/dL (ref 0.44–1.00)
GFR, Estimated: >60 mL/min
Glucose, Bld: 100 mg/dL — ABNORMAL HIGH (ref 70–99)
Potassium: 3.8 mmol/L (ref 3.5–5.1)
Sodium: 139 mmol/L (ref 135–145)
Total Bilirubin: 0.5 mg/dL (ref 0.3–1.2)
Total Protein: 6.3 g/dL — ABNORMAL LOW (ref 6.5–8.1)

## 2020-09-15 LAB — URINALYSIS, ROUTINE W REFLEX MICROSCOPIC
Bilirubin Urine: NEGATIVE
Glucose, UA: NEGATIVE mg/dL
Hgb urine dipstick: NEGATIVE
Ketones, ur: NEGATIVE mg/dL
Leukocytes,Ua: NEGATIVE
Nitrite: NEGATIVE
Protein, ur: NEGATIVE mg/dL
Specific Gravity, Urine: >1.046 — ABNORMAL HIGH (ref 1.005–1.030)
pH: 6 (ref 5.0–8.0)

## 2020-09-15 LAB — LIPASE, BLOOD: Lipase: 37 U/L (ref 11–51)

## 2020-09-15 MED ORDER — IOHEXOL 300 MG/ML  SOLN
100.0000 mL | Freq: Once | INTRAMUSCULAR | Status: AC | PRN
  Administered 2020-09-15: 100 mL via INTRAVENOUS

## 2020-09-15 MED ORDER — TETANUS-DIPHTH-ACELL PERTUSSIS 5-2.5-18.5 LF-MCG/0.5 IM SUSY
0.5000 mL | PREFILLED_SYRINGE | Freq: Once | INTRAMUSCULAR | Status: AC
  Administered 2020-09-15: 0.5 mL via INTRAMUSCULAR
  Filled 2020-09-15: qty 0.5

## 2020-09-15 NOTE — ED Notes (Signed)
Pt ambulated down hallway to bathroom without difficulty. No increase in pain when walking. Gait steady. No dizziness or SOB.

## 2020-09-15 NOTE — Discharge Instructions (Addendum)
The CAT scans and x-rays did not show any fractures or serious problems.  The CT scan of the head did show some areas of encephalomalacia which could indicate prior strokes.  The CAT scan of the chest showed a right upper lung nodule, which was small and it is recommended that you have a repeat CAT scan in 1 year, since you are a smoker.  Use ice on the sore areas 3-4 times a day for 2 to 3 days after that he can help.  Use Motrin or Tylenol as needed for pain.

## 2020-09-15 NOTE — Telephone Encounter (Signed)
Spoke with pt in office today. She reports she had 3 falls last week. Bruised up legs pretty bad. Denies any significant injuries from falls last week.  Has not fallen this week. She also wanted to report that she has had covid-19 twice (August 2021, 07/2020). She has follow up 10/28/20 with Dr. Felecia Shelling. She will discuss with MD then. She will call if she has any new or worsening sx prior to then.
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
## 2020-09-15 NOTE — Telephone Encounter (Signed)
I spoke with the patient. 

## 2020-09-16 ENCOUNTER — Telehealth: Payer: Self-pay | Admitting: Neurology
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
## 2020-09-16 NOTE — Telephone Encounter (Signed)
Reviewed discharge notes from hospital. States: "Schedule an appointment with Felecia Shelling, Nanine Means, MD (Neurology); To be seen for evaluation and treatment of the abnormal CT which showed some areas of encephalomalacia"

## 2020-09-16 NOTE — Telephone Encounter (Signed)
I looked at the CT -- the changes are from her MS and were seen on prior MRI.  She can keep scheduled appt.
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
## 2020-09-16 NOTE — Telephone Encounter (Signed)
Called pt back. Relayed Dr. Garth Bigness message. She verbalized understanding and will keep appt in April as scheduled.

## 2020-09-22 DIAGNOSIS — S8010XA Contusion of unspecified lower leg, initial encounter: Secondary | ICD-10-CM | POA: Diagnosis not present

## 2020-09-22 DIAGNOSIS — G35 Multiple sclerosis: Secondary | ICD-10-CM | POA: Diagnosis not present

## 2020-09-22 DIAGNOSIS — E782 Mixed hyperlipidemia: Secondary | ICD-10-CM | POA: Diagnosis not present

## 2020-09-22 DIAGNOSIS — Z6824 Body mass index (BMI) 24.0-24.9, adult: Secondary | ICD-10-CM | POA: Diagnosis not present

## 2020-09-22 DIAGNOSIS — R911 Solitary pulmonary nodule: Secondary | ICD-10-CM | POA: Diagnosis not present

## 2020-09-22 DIAGNOSIS — S8000XA Contusion of unspecified knee, initial encounter: Secondary | ICD-10-CM | POA: Diagnosis not present

## 2020-09-22 DIAGNOSIS — K219 Gastro-esophageal reflux disease without esophagitis: Secondary | ICD-10-CM | POA: Diagnosis not present

## 2020-09-22 DIAGNOSIS — S20219A Contusion of unspecified front wall of thorax, initial encounter: Secondary | ICD-10-CM | POA: Diagnosis not present

## 2020-10-13 DIAGNOSIS — E7849 Other hyperlipidemia: Secondary | ICD-10-CM | POA: Diagnosis not present

## 2020-10-13 DIAGNOSIS — G35 Multiple sclerosis: Secondary | ICD-10-CM | POA: Diagnosis not present

## 2020-10-13 DIAGNOSIS — E119 Type 2 diabetes mellitus without complications: Secondary | ICD-10-CM | POA: Diagnosis not present

## 2020-10-13 DIAGNOSIS — G500 Trigeminal neuralgia: Secondary | ICD-10-CM | POA: Diagnosis not present

## 2020-10-28 ENCOUNTER — Ambulatory Visit: Payer: PPO | Admitting: Neurology

## 2020-10-28 ENCOUNTER — Encounter: Payer: Self-pay | Admitting: Neurology

## 2020-10-28 VITALS — BP 100/60 | HR 58 | Ht 62.5 in | Wt 135.0 lb

## 2020-10-28 DIAGNOSIS — G501 Atypical facial pain: Secondary | ICD-10-CM | POA: Diagnosis not present

## 2020-10-28 DIAGNOSIS — G35 Multiple sclerosis: Secondary | ICD-10-CM | POA: Diagnosis not present

## 2020-10-28 DIAGNOSIS — R5383 Other fatigue: Secondary | ICD-10-CM | POA: Diagnosis not present

## 2020-10-28 DIAGNOSIS — R261 Paralytic gait: Secondary | ICD-10-CM | POA: Diagnosis not present

## 2020-10-28 DIAGNOSIS — G4700 Insomnia, unspecified: Secondary | ICD-10-CM | POA: Diagnosis not present

## 2020-10-28 MED ORDER — HYDROXYZINE HCL 10 MG PO TABS: 10.0000 mg | ORAL_TABLET | Freq: Every day | ORAL | 4 refills | Status: DC

## 2020-10-28 MED ORDER — BUSPIRONE HCL 15 MG PO TABS: 15.0000 mg | ORAL_TABLET | Freq: Two times a day (BID) | ORAL | 4 refills | Status: DC

## 2020-10-28 MED ORDER — GABAPENTIN 600 MG PO TABS: ORAL_TABLET | ORAL | 4 refills | Status: DC

## 2020-10-28 MED ORDER — BACLOFEN 10 MG PO TABS: 10.0000 mg | ORAL_TABLET | Freq: Three times a day (TID) | ORAL | 4 refills | Status: DC

## 2020-10-28 MED ORDER — LAMOTRIGINE 200 MG PO TABS: 200.0000 mg | ORAL_TABLET | Freq: Two times a day (BID) | ORAL | 4 refills | Status: DC
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
## 2020-11-01 ENCOUNTER — Telehealth: Payer: Self-pay | Admitting: Neurology

## 2020-11-01 NOTE — Telephone Encounter (Signed)
health team please schedule for Sept order sent to GI. They will reach out to the patient to schedule.

## 2021-03-15 ENCOUNTER — Ambulatory Visit
Admission: RE | Admit: 2021-03-15 | Discharge: 2021-03-15 | Disposition: A | Discharge status: Home / Self Care | Payer: PPO | Source: Ambulatory Visit | Attending: Neurology | Admitting: Neurology

## 2021-03-15 DIAGNOSIS — G35 Multiple sclerosis: Secondary | ICD-10-CM

## 2021-03-15 DIAGNOSIS — R261 Paralytic gait: Secondary | ICD-10-CM

## 2021-04-25 ENCOUNTER — Ambulatory Visit: Payer: PPO | Admitting: Family Medicine

## 2021-05-02 ENCOUNTER — Other Ambulatory Visit: Payer: Self-pay | Admitting: Neurology

## 2021-05-02 DIAGNOSIS — G35 Multiple sclerosis: Secondary | ICD-10-CM

## 2021-06-22 DIAGNOSIS — H25813 Combined forms of age-related cataract, bilateral: Secondary | ICD-10-CM | POA: Diagnosis not present

## 2021-06-22 DIAGNOSIS — H5213 Myopia, bilateral: Secondary | ICD-10-CM | POA: Diagnosis not present

## 2021-06-22 DIAGNOSIS — H524 Presbyopia: Secondary | ICD-10-CM | POA: Diagnosis not present

## 2021-06-22 DIAGNOSIS — H52221 Regular astigmatism, right eye: Secondary | ICD-10-CM | POA: Diagnosis not present

## 2021-07-19 ENCOUNTER — Encounter: Payer: Self-pay | Admitting: Family Medicine

## 2021-07-19 ENCOUNTER — Ambulatory Visit: Payer: PPO | Admitting: Family Medicine

## 2021-07-19 ENCOUNTER — Other Ambulatory Visit: Payer: Self-pay

## 2021-07-19 VITALS — BP 124/74 | HR 60 | Ht 62.5 in | Wt 122.0 lb

## 2021-07-19 DIAGNOSIS — F419 Anxiety disorder, unspecified: Secondary | ICD-10-CM | POA: Diagnosis not present

## 2021-07-19 DIAGNOSIS — R202 Paresthesia of skin: Secondary | ICD-10-CM | POA: Diagnosis not present

## 2021-07-19 DIAGNOSIS — G501 Atypical facial pain: Secondary | ICD-10-CM | POA: Diagnosis not present

## 2021-07-19 DIAGNOSIS — R261 Paralytic gait: Secondary | ICD-10-CM

## 2021-07-19 DIAGNOSIS — Z79899 Other long term (current) drug therapy: Secondary | ICD-10-CM

## 2021-07-19 DIAGNOSIS — G4700 Insomnia, unspecified: Secondary | ICD-10-CM

## 2021-07-19 DIAGNOSIS — E538 Deficiency of other specified B group vitamins: Secondary | ICD-10-CM

## 2021-07-19 DIAGNOSIS — R5383 Other fatigue: Secondary | ICD-10-CM | POA: Diagnosis not present

## 2021-07-19 DIAGNOSIS — F32A Depression, unspecified: Secondary | ICD-10-CM | POA: Diagnosis not present

## 2021-07-19 DIAGNOSIS — R200 Anesthesia of skin: Secondary | ICD-10-CM

## 2021-07-19 DIAGNOSIS — G35 Multiple sclerosis: Secondary | ICD-10-CM | POA: Diagnosis not present

## 2021-07-19 DIAGNOSIS — E559 Vitamin D deficiency, unspecified: Secondary | ICD-10-CM
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
## 2021-07-19 NOTE — Patient Instructions (Signed)
Below is our plan:  We will continue current treatment plan. Please consider using a cane or walking stick to help prevent falls.   Please make sure you are staying well hydrated. I recommend 50-60 ounces daily. Well balanced diet and regular exercise encouraged. Consistent sleep schedule with 6-8 hours recommended.   Please continue follow up with care team as directed.   Follow up with Dr Felecia Shelling in 6 months   You may receive a survey regarding today's visit. I encourage you to leave honest feed back as I do use this information to improve patient care. Thank you for seeing me today!

## 2021-07-20 LAB — CBC WITH DIFFERENTIAL/PLATELET
Basophils Absolute: 0 10*3/uL (ref 0.0–0.2)
Basos: 0 %
EOS (ABSOLUTE): 0.1 10*3/uL (ref 0.0–0.4)
Eos: 1 %
Hematocrit: 41.9 % (ref 34.0–46.6)
Hemoglobin: 14.6 g/dL (ref 11.1–15.9)
Immature Grans (Abs): 0 10*3/uL (ref 0.0–0.1)
Immature Granulocytes: 0 %
Lymphocytes Absolute: 1.4 10*3/uL (ref 0.7–3.1)
Lymphs: 16 %
MCH: 32 pg (ref 26.6–33.0)
MCHC: 34.8 g/dL (ref 31.5–35.7)
MCV: 92 fL (ref 79–97)
Monocytes Absolute: 0.6 10*3/uL (ref 0.1–0.9)
Monocytes: 6 %
Neutrophils Absolute: 6.7 10*3/uL (ref 1.4–7.0)
Neutrophils: 77 %
Platelets: 197 10*3/uL (ref 150–450)
RBC: 4.56 x10E6/uL (ref 3.77–5.28)
RDW: 12.2 % (ref 11.7–15.4)
WBC: 8.8 10*3/uL (ref 3.4–10.8)

## 2021-07-20 LAB — COMPREHENSIVE METABOLIC PANEL
ALT: 33 IU/L — ABNORMAL HIGH (ref 0–32)
AST: 23 IU/L (ref 0–40)
Albumin/Globulin Ratio: 2.7 — ABNORMAL HIGH (ref 1.2–2.2)
Albumin: 4.8 g/dL (ref 3.8–4.9)
Alkaline Phosphatase: 174 IU/L — ABNORMAL HIGH (ref 44–121)
BUN/Creatinine Ratio: 14 (ref 9–23)
BUN: 9 mg/dL (ref 6–24)
Bilirubin Total: 0.3 mg/dL (ref 0.0–1.2)
CO2: 25 mmol/L (ref 20–29)
Calcium: 9.4 mg/dL (ref 8.7–10.2)
Chloride: 106 mmol/L (ref 96–106)
Creatinine, Ser: 0.64 mg/dL (ref 0.57–1.00)
Globulin, Total: 1.8 g/dL (ref 1.5–4.5)
Glucose: 81 mg/dL (ref 70–99)
Potassium: 4.6 mmol/L (ref 3.5–5.2)
Sodium: 149 mmol/L — ABNORMAL HIGH (ref 134–144)
Total Protein: 6.6 g/dL (ref 6.0–8.5)
eGFR: 105 mL/min/{1.73_m2} (ref >59)

## 2021-07-20 LAB — VITAMIN D 25 HYDROXY (VIT D DEFICIENCY, FRACTURES): Vit D, 25-Hydroxy: 38.9 ng/mL (ref 30.0–100.0)

## 2021-07-20 LAB — VITAMIN B12: Vitamin B-12: 310 pg/mL (ref 232–1245)

## 2021-09-08 DIAGNOSIS — H01004 Unspecified blepharitis left upper eyelid: Secondary | ICD-10-CM | POA: Diagnosis not present

## 2021-09-08 DIAGNOSIS — H01001 Unspecified blepharitis right upper eyelid: Secondary | ICD-10-CM | POA: Diagnosis not present

## 2021-09-08 DIAGNOSIS — H01005 Unspecified blepharitis left lower eyelid: Secondary | ICD-10-CM | POA: Diagnosis not present

## 2021-09-08 DIAGNOSIS — H2513 Age-related nuclear cataract, bilateral: Secondary | ICD-10-CM | POA: Diagnosis not present

## 2021-09-08 DIAGNOSIS — H01002 Unspecified blepharitis right lower eyelid: Secondary | ICD-10-CM | POA: Diagnosis not present

## 2021-09-20 DIAGNOSIS — E7849 Other hyperlipidemia: Secondary | ICD-10-CM | POA: Diagnosis not present

## 2021-09-20 DIAGNOSIS — D519 Vitamin B12 deficiency anemia, unspecified: Secondary | ICD-10-CM | POA: Diagnosis not present

## 2021-09-20 DIAGNOSIS — E782 Mixed hyperlipidemia: Secondary | ICD-10-CM | POA: Diagnosis not present

## 2021-09-20 DIAGNOSIS — K219 Gastro-esophageal reflux disease without esophagitis: Secondary | ICD-10-CM | POA: Diagnosis not present

## 2021-09-20 DIAGNOSIS — E7801 Familial hypercholesterolemia: Secondary | ICD-10-CM | POA: Diagnosis not present

## 2021-09-20 DIAGNOSIS — E7800 Pure hypercholesterolemia, unspecified: Secondary | ICD-10-CM | POA: Diagnosis not present

## 2021-09-20 DIAGNOSIS — R945 Abnormal results of liver function studies: Secondary | ICD-10-CM | POA: Diagnosis not present

## 2021-09-20 DIAGNOSIS — E559 Vitamin D deficiency, unspecified: Secondary | ICD-10-CM | POA: Diagnosis not present

## 2021-09-23 DIAGNOSIS — G500 Trigeminal neuralgia: Secondary | ICD-10-CM | POA: Diagnosis not present

## 2021-09-23 DIAGNOSIS — N951 Menopausal and female climacteric states: Secondary | ICD-10-CM | POA: Diagnosis not present

## 2021-09-23 DIAGNOSIS — E559 Vitamin D deficiency, unspecified: Secondary | ICD-10-CM | POA: Diagnosis not present

## 2021-09-23 DIAGNOSIS — Z1331 Encounter for screening for depression: Secondary | ICD-10-CM | POA: Diagnosis not present

## 2021-09-23 DIAGNOSIS — G35 Multiple sclerosis: Secondary | ICD-10-CM | POA: Diagnosis not present

## 2021-09-23 DIAGNOSIS — D519 Vitamin B12 deficiency anemia, unspecified: Secondary | ICD-10-CM | POA: Diagnosis not present

## 2021-09-23 DIAGNOSIS — Z1389 Encounter for screening for other disorder: Secondary | ICD-10-CM | POA: Diagnosis not present

## 2021-09-23 DIAGNOSIS — E7849 Other hyperlipidemia: Secondary | ICD-10-CM | POA: Diagnosis not present

## 2021-09-29 ENCOUNTER — Other Ambulatory Visit: Payer: Self-pay | Admitting: Neurology

## 2021-10-27 ENCOUNTER — Encounter (HOSPITAL_COMMUNITY)
Admission: RE | Admit: 2021-10-27 | Discharge: 2021-10-27 | Disposition: A | Discharge status: Home / Self Care | Payer: PPO | Source: Ambulatory Visit | Attending: Ophthalmology | Admitting: Ophthalmology

## 2021-10-27 DIAGNOSIS — H2512 Age-related nuclear cataract, left eye: Secondary | ICD-10-CM | POA: Diagnosis not present

## 2021-10-27 HISTORY — DX: Multiple sclerosis: G35

## 2021-10-27 NOTE — Pre-Procedure Instructions (Signed)
Attempted pre-op phone call. Left voicemail for patient to call back. ?

## 2021-10-31 ENCOUNTER — Other Ambulatory Visit: Payer: Self-pay

## 2021-10-31 ENCOUNTER — Encounter (HOSPITAL_COMMUNITY): Payer: Self-pay

## 2021-11-02 NOTE — H&P (Signed)
Surgical History & Physical ? ?Patient Name: Megan Chan DOB: 08/27/1965 ? ?Surgery: Cataract extraction with intraocular lens implant phacoemulsification; Left Eye ? ?Surgeon: Baruch Goldmann MD ?Surgery Date:  11-04-21 ?Pre-Op Date:  10-27-21 ? ?HPI: ?A 65 Yr. old female patient The patient is here for a cataract evaluation of both eyes, referred from Dr. Hassell Done. The affected area is worsening. Patient is not taking medications. The patient's vision is blurry. The complaint is associated with difficulty small print on medicine bottles/labels, difficulty reading traffic signs/street signs, and experiencing glare on bright sunny days. This is negatively affecting the patient's quality of life and the patient is unable to function adequately in life with the current level of vision. HPI Completed by Dr. Baruch Goldmann ? ?Medical History: ?Macula Degeneration ?Glaucoma ?Cataracts ?LDL ?MS Trigeminal Neuralgia ? ?Review of Systems ?Negative Allergic/Immunologic ?Negative Cardiovascular ?Negative Constitutional ?Negative Ear, Nose, Mouth & Throat ?Negative Endocrine ?Negative Eyes ?Negative Gastrointestinal ?Negative Genitourinary ?Negative Hemotologic/Lymphatic ?Negative Integumentary ?Negative Musculoskeletal ?Negative Neurological ?Negative Psychiatry ?Negative Respiratory ? ?Social ?  Smoker, current status unknown  ? ?Medication ?Dimethyl Fumarate, Gabapentin, Lamotrigine, Buspirone, Baclofen, Cyclobenzaprine, Fluoxetine, Rosuvastatin, Alprazolam, Hydroxyzine, Ondansetron, Omeprazole,  ? ?Sx/Procedures ?Hysterectomy, Tooth extraction, Staples- head,  ? ?Drug Allergies  ?Bees, Sulfa, Codeine, Pencillin,  ? ?History & Physical: ?Heent: Cataract, Left Eye ?NECK: supple without bruits ?LUNGS: lungs clear to auscultation ?CV: regular rate and rhythm ?Abdomen: soft and non-tender ? ?Impression & Plan: ?Assessment: ?1.  NUCLEAR SCLEROSIS AGE RELATED; Both Eyes (H25.13) ?2.  BLEPHARITIS; Right Upper Lid, Right Lower  Lid, Left Upper Lid, Left Lower Lid (H01.001, H01.002,H01.004,H01.005) ? ?Plan: 1.  Cataract accounts for the patient's decreased vision. This visual impairment is not correctable with a tolerable change in glasses or contact lenses. Cataract surgery with an implantation of a new lens should significantly improve the visual and functional status of the patient. Discussed all risks, benefits, alternatives, and potential complications. Discussed the procedures and recovery. Patient desires to have surgery. A-scan ordered and performed today for intra-ocular lens calculations. The surgery will be performed in order to improve vision for driving, reading, and for eye examinations. Recommend phacoemulsification with intra-ocular lens. Recommend Dextenza for post-operative pain and inflammation. ?Left Eye worse - first. ?Dilates well - shugarcaine by protocol. ? ?2.  Recommend regular lid cleaning. ?

## 2021-11-04 ENCOUNTER — Ambulatory Visit (HOSPITAL_BASED_OUTPATIENT_CLINIC_OR_DEPARTMENT_OTHER): Payer: PPO | Admitting: Anesthesiology

## 2021-11-04 ENCOUNTER — Ambulatory Visit (HOSPITAL_COMMUNITY): Payer: PPO | Admitting: Anesthesiology

## 2021-11-04 ENCOUNTER — Encounter (HOSPITAL_COMMUNITY)
Admission: RE | Disposition: A | Discharge status: Home / Self Care | Payer: Self-pay | Source: Home / Self Care | Attending: Ophthalmology

## 2021-11-04 ENCOUNTER — Encounter (HOSPITAL_COMMUNITY): Payer: Self-pay | Admitting: Ophthalmology

## 2021-11-04 ENCOUNTER — Ambulatory Visit (HOSPITAL_COMMUNITY)
Admission: RE | Admit: 2021-11-04 | Discharge: 2021-11-04 | Disposition: A | Discharge status: Home / Self Care | Payer: PPO | Attending: Ophthalmology | Admitting: Ophthalmology

## 2021-11-04 DIAGNOSIS — G709 Myoneural disorder, unspecified: Secondary | ICD-10-CM | POA: Diagnosis not present

## 2021-11-04 DIAGNOSIS — F17210 Nicotine dependence, cigarettes, uncomplicated: Secondary | ICD-10-CM | POA: Diagnosis not present

## 2021-11-04 DIAGNOSIS — H0100A Unspecified blepharitis right eye, upper and lower eyelids: Secondary | ICD-10-CM | POA: Insufficient documentation

## 2021-11-04 DIAGNOSIS — H2512 Age-related nuclear cataract, left eye: Secondary | ICD-10-CM

## 2021-11-04 DIAGNOSIS — F419 Anxiety disorder, unspecified: Secondary | ICD-10-CM | POA: Diagnosis not present

## 2021-11-04 DIAGNOSIS — F17200 Nicotine dependence, unspecified, uncomplicated: Secondary | ICD-10-CM | POA: Insufficient documentation

## 2021-11-04 DIAGNOSIS — F32A Depression, unspecified: Secondary | ICD-10-CM | POA: Insufficient documentation

## 2021-11-04 DIAGNOSIS — N289 Disorder of kidney and ureter, unspecified: Secondary | ICD-10-CM | POA: Diagnosis not present

## 2021-11-04 DIAGNOSIS — H0100B Unspecified blepharitis left eye, upper and lower eyelids: Secondary | ICD-10-CM | POA: Insufficient documentation

## 2021-11-04 DIAGNOSIS — K219 Gastro-esophageal reflux disease without esophagitis: Secondary | ICD-10-CM | POA: Diagnosis not present

## 2021-11-04 DIAGNOSIS — F418 Other specified anxiety disorders: Secondary | ICD-10-CM

## 2021-11-04 HISTORY — PX: CATARACT EXTRACTION W/PHACO: SHX586

## 2021-11-04 SURGERY — PHACOEMULSIFICATION, CATARACT, WITH IOL INSERTION
Anesthesia: Monitor Anesthesia Care | Site: Eye | Laterality: Left

## 2021-11-04 MED ORDER — LIDOCAINE HCL 3.5 % OP GEL
1.0000 "application " | Freq: Once | OPHTHALMIC | Status: AC
  Administered 2021-11-04: 1 via OPHTHALMIC

## 2021-11-04 MED ORDER — POVIDONE-IODINE 5 % OP SOLN
OPHTHALMIC | Status: DC | PRN
  Administered 2021-11-04: 1 via OPHTHALMIC

## 2021-11-04 MED ORDER — PHENYLEPHRINE HCL 2.5 % OP SOLN
1.0000 [drp] | OPHTHALMIC | Status: AC | PRN
  Administered 2021-11-04 (×3): 1 [drp] via OPHTHALMIC

## 2021-11-04 MED ORDER — STERILE WATER FOR IRRIGATION IR SOLN
Status: DC | PRN
  Administered 2021-11-04: 250 mL

## 2021-11-04 MED ORDER — MIDAZOLAM HCL 2 MG/2ML IJ SOLN
INTRAMUSCULAR | Status: AC
  Filled 2021-11-04: qty 2

## 2021-11-04 MED ORDER — TROPICAMIDE 1 % OP SOLN
1.0000 [drp] | OPHTHALMIC | Status: AC | PRN
  Administered 2021-11-04 (×3): 1 [drp] via OPHTHALMIC

## 2021-11-04 MED ORDER — LIDOCAINE HCL (PF) 1 % IJ SOLN
INTRAOCULAR | Status: DC | PRN
  Administered 2021-11-04: 1 mL via OPHTHALMIC

## 2021-11-04 MED ORDER — TETRACAINE HCL 0.5 % OP SOLN
1.0000 [drp] | OPHTHALMIC | Status: AC | PRN
  Administered 2021-11-04 (×3): 1 [drp] via OPHTHALMIC

## 2021-11-04 MED ORDER — EPINEPHRINE PF 1 MG/ML IJ SOLN
INTRAOCULAR | Status: DC | PRN
  Administered 2021-11-04: 500 mL

## 2021-11-04 MED ORDER — SIGHTPATH DOSE#1 NA HYALUR & NA CHOND-NA HYALUR IO KIT
PACK | INTRAOCULAR | Status: DC | PRN
  Administered 2021-11-04: 1 via OPHTHALMIC

## 2021-11-04 MED ORDER — BSS IO SOLN
INTRAOCULAR | Status: DC | PRN
Start: 2021-11-04 — End: 2021-11-04
  Administered 2021-11-04: 15 mL via INTRAOCULAR

## 2021-11-04 SURGICAL SUPPLY — 19 items
CATARACT SUITE SIGHTPATH (MISCELLANEOUS) ×2 IMPLANT
CLOTH BEACON ORANGE TIMEOUT ST (SAFETY) ×2 IMPLANT
EYE SHIELD UNIVERSAL CLEAR (GAUZE/BANDAGES/DRESSINGS) ×1 IMPLANT
FEE CATARACT SUITE SIGHTPATH (MISCELLANEOUS) ×1 IMPLANT
GLOVE BIOGEL PI IND STRL 7.0 (GLOVE) ×2 IMPLANT
GLOVE BIOGEL PI IND STRL 8 (GLOVE) IMPLANT
GLOVE BIOGEL PI INDICATOR 7.0 (GLOVE) ×2
GLOVE BIOGEL PI INDICATOR 8 (GLOVE) ×1
GOWN STRL REUS W/TWL XL LVL3 (GOWN DISPOSABLE) ×1 IMPLANT
LENS IOL RAYNER 21.0 (Intraocular Lens) ×2 IMPLANT
LENS IOL RAYONE EMV 21.0 (Intraocular Lens) IMPLANT
NDL HYPO 18GX1.5 BLUNT FILL (NEEDLE) ×1 IMPLANT
NEEDLE HYPO 18GX1.5 BLUNT FILL (NEEDLE) ×2 IMPLANT
PAD ARMBOARD 7.5X6 YLW CONV (MISCELLANEOUS) ×2 IMPLANT
RING MALYGIN 7.0 (MISCELLANEOUS) IMPLANT
SYR TB 1ML LL NO SAFETY (SYRINGE) ×2 IMPLANT
TAPE SURG TRANSPORE 1 IN (GAUZE/BANDAGES/DRESSINGS) IMPLANT
TAPE SURGICAL TRANSPORE 1 IN (GAUZE/BANDAGES/DRESSINGS) ×1
WATER STERILE IRR 250ML POUR (IV SOLUTION) ×2 IMPLANT

## 2021-11-04 NOTE — Interval H&P Note (Signed)
History and Physical Interval Note: ? ?11/04/2021 ?9:27 AM ? ?Megan Chan  has presented today for surgery, with the diagnosis of nuclear sclerosis age related cataract; left.  The various methods of treatment have been discussed with the patient and family. After consideration of risks, benefits and other options for treatment, the patient has consented to  Procedure(s) with comments: ?CATARACT EXTRACTION PHACO AND INTRAOCULAR LENS PLACEMENT (IOC) (Left) - CDE: as a surgical intervention.  The patient's history has been reviewed, patient examined, no change in status, stable for surgery.  I have reviewed the patient's chart and labs.  Questions were answered to the patient's satisfaction.   ? ? ?Baruch Goldmann ? ? ?

## 2021-11-04 NOTE — Op Note (Signed)
Date of procedure: 11/04/21 ? ?Pre-operative diagnosis: Visually significant age-related nuclear cataract, Left Eye (H25.12) ? ?Post-operative diagnosis: Visually significant age-related nuclear cataract, Left Eye ? ?Procedure: Removal of cataract via phacoemulsification and insertion of intra-ocular lens Rayner [id number] +21.0D into the capsular bag of the Left Eye ? ?Attending surgeon: Gerda Diss. Marisa Hua, MD, MA ? ?Anesthesia: MAC, Topical Akten ? ?Complications: None ? ?Estimated Blood Loss: <72m (minimal) ? ?Specimens: None ? ?Implants: As above ? ?Indications:  Visually significant age-related cataract, Left Eye ? ?Procedure:  ?The patient was seen and identified in the pre-operative area. The operative eye was identified and dilated.  The operative eye was marked.  Topical anesthesia was administered to the operative eye.    ? ?The patient was then to the operative suite and placed in the supine position.  A timeout was performed confirming the patient, procedure to be performed, and all other relevant information.   The patient's face was prepped and draped in the usual fashion for intra-ocular surgery.  A lid speculum was placed into the operative eye and the surgical microscope moved into place and focused.  An inferotemporal paracentesis was created using a 20 gauge paracentesis blade.  Shugarcaine was injected into the anterior chamber.  Viscoelastic was injected into the anterior chamber.  A temporal clear-corneal main wound incision was created using a 2.443mmicrokeratome.  A continuous curvilinear capsulorrhexis was initiated using an irrigating cystitome and completed using capsulorrhexis forceps.  Hydrodissection and hydrodeliniation were performed.  Viscoelastic was injected into the anterior chamber.  A phacoemulsification handpiece and a chopper as a second instrument were used to remove the nucleus and epinucleus. The irrigation/aspiration handpiece was used to remove any remaining cortical material.   ? ?The capsular bag was reinflated with viscoelastic, checked, and found to be intact.  The intraocular lens was inserted into the capsular bag.  The irrigation/aspiration handpiece was used to remove any remaining viscoelastic.  The clear corneal wound and paracentesis wounds were then hydrated and checked with Weck-Cels to be watertight.  The lid-speculum and drape was removed, and the patient's face was cleaned with a wet and dry 4x4.  A clear shield was taped over the eye. The patient was taken to the post-operative care unit in good condition, having tolerated the procedure well. ? ?Post-Op Instructions: [facility] and will receive all other orders and instructions. ? ?

## 2021-11-04 NOTE — Anesthesia Preprocedure Evaluation (Signed)
Anesthesia Evaluation  ?Patient identified by MRN, date of birth, ID band ?Patient awake ? ? ? ?Reviewed: ?Allergy & Precautions, H&P , NPO status , Patient's Chart, lab work & pertinent test results, reviewed documented beta blocker date and time  ? ?Airway ?Mallampati: II ? ?TM Distance: >3 FB ?Neck ROM: full ? ? ? Dental ?no notable dental hx. ? ?  ?Pulmonary ?neg pulmonary ROS, Current Smoker,  ?  ?Pulmonary exam normal ?breath sounds clear to auscultation ? ? ? ? ? ? Cardiovascular ?Exercise Tolerance: Good ?negative cardio ROS ? ? ?Rhythm:regular Rate:Normal ? ? ?  ?Neuro/Psych ?PSYCHIATRIC DISORDERS Anxiety Depression  Neuromuscular disease   ? GI/Hepatic ?Neg liver ROS, GERD  Medicated,  ?Endo/Other  ?negative endocrine ROS ? Renal/GU ?Renal disease  ?negative genitourinary ?  ?Musculoskeletal ? ? Abdominal ?  ?Peds ? Hematology ?negative hematology ROS ?(+)   ?Anesthesia Other Findings ? ? Reproductive/Obstetrics ?negative OB ROS ? ?  ? ? ? ? ? ? ? ? ? ? ? ? ? ?  ?  ? ? ? ? ? ? ? ? ?Anesthesia Physical ?Anesthesia Plan ? ?ASA: 3 ? ?Anesthesia Plan: MAC  ? ?Post-op Pain Management:   ? ?Induction:  ? ?PONV Risk Score and Plan:  ? ?Airway Management Planned:  ? ?Additional Equipment:  ? ?Intra-op Plan:  ? ?Post-operative Plan:  ? ?Informed Consent: I have reviewed the patients History and Physical, chart, labs and discussed the procedure including the risks, benefits and alternatives for the proposed anesthesia with the patient or authorized representative who has indicated his/her understanding and acceptance.  ? ? ? ?Dental Advisory Given ? ?Plan Discussed with: CRNA ? ?Anesthesia Plan Comments:   ? ? ? ? ? ? ?Anesthesia Quick Evaluation ? ?

## 2021-11-04 NOTE — Anesthesia Postprocedure Evaluation (Signed)
Anesthesia Post Note ? ?Patient: Megan Chan ? ?Procedure(s) Performed: CATARACT EXTRACTION PHACO AND INTRAOCULAR LENS PLACEMENT (IOC) (Left: Eye) ? ?Patient location during evaluation: Short Stay ?Anesthesia Type: MAC ?Pain management: pain level controlled ?Vital Signs Assessment: post-procedure vital signs reviewed and stable ?Respiratory status: spontaneous breathing ?Cardiovascular status: blood pressure returned to baseline and stable ?Postop Assessment: no apparent nausea or vomiting ?Anesthetic complications: no ? ? ?No notable events documented. ? ? ?Last Vitals:  ?Vitals:  ? 11/04/21 0829  ?Pulse: (!) 59  ?Resp: 18  ?Temp: 36.6 ?C  ?SpO2: 98%  ?  ?Last Pain:  ?Vitals:  ? 11/04/21 0829  ?TempSrc: Oral  ? ? ?  ?  ?  ?  ?  ?  ? ?Natalie Mceuen ? ? ? ? ?

## 2021-11-04 NOTE — Transfer of Care (Signed)
Immediate Anesthesia Transfer of Care Note ? ?Patient: Megan Chan ? ?Procedure(s) Performed: CATARACT EXTRACTION PHACO AND INTRAOCULAR LENS PLACEMENT (IOC) (Left: Eye) ? ?Patient Location: Short Stay ? ?Anesthesia Type:MAC ? ?Level of Consciousness: awake ? ?Airway & Oxygen Therapy: Patient Spontanous Breathing ? ?Post-op Assessment: Report given to RN ? ?Post vital signs: Reviewed and stable ? ?Last Vitals:  ?Vitals Value Taken Time  ?BP    ?Temp    ?Pulse    ?Resp    ?SpO2    ? ? ?Last Pain:  ?Vitals:  ? 11/04/21 0829  ?TempSrc: Oral  ?   ? ?Patients Stated Pain Goal: 8 (11/04/21 0829) ? ?Complications: No notable events documented. ?

## 2021-11-04 NOTE — Discharge Instructions (Addendum)
Please discharge patient when stable, will follow up today with Dr. Wrzosek at the [facility] [place] office immediately following discharge.  Leave shield in place until visit.  All paperwork with discharge instructions will be given at the office.  [facility] [place] Address:  [address]  [place], [place] [address]  

## 2021-11-08 ENCOUNTER — Encounter (HOSPITAL_COMMUNITY): Payer: Self-pay | Admitting: Ophthalmology

## 2021-11-10 ENCOUNTER — Encounter (HOSPITAL_COMMUNITY)
Admission: RE | Admit: 2021-11-10 | Discharge: 2021-11-10 | Disposition: A | Discharge status: Home / Self Care | Payer: PPO | Source: Ambulatory Visit | Attending: Ophthalmology | Admitting: Ophthalmology

## 2021-11-10 ENCOUNTER — Other Ambulatory Visit: Payer: Self-pay

## 2021-11-10 ENCOUNTER — Encounter (HOSPITAL_COMMUNITY): Payer: Self-pay

## 2021-11-10 DIAGNOSIS — H2511 Age-related nuclear cataract, right eye: Secondary | ICD-10-CM | POA: Diagnosis not present

## 2021-11-11 NOTE — H&P (Signed)
Surgical History & Physical ? ?Patient Name: Megan Chan DOB: Aug 18, 1965 ? ?Surgery: Cataract extraction with intraocular lens implant phacoemulsification; Right Eye ? ?Surgeon: Baruch Goldmann MD ?Surgery Date:  11-18-21 ?Pre-Op Date:  11-10-21 ? ?HPI: ?A 60 Yr. old female patient 1. The patient is returning after cataract surgery. The left eye is affected. Status post cataract surgery, which began 1 weeks ago: Since the last visit, the affected area is doing well. The patient's vision is stable. Pt has not noticed much change at all. Patient is following medication instructions. Pt has also noticed a new twitching OS only. 2. 2. The patient is returning for a cataract follow-up of the right eye. Since the last visit, the affected area is worsening. The patient's vision is blurry. The complaint is associated with light sensitivity; seeing at night due to halos/glare, experiencing glare on bright sunny days, and recognizing people at a distance. This is negatively affecting the patient's quality of life and the patient is unable to function adequately in life with the current level of vision. ? ?Medical History: ?Macula Degeneration ?Glaucoma ?Cataracts ?LDL ?MS Trigeminal Neuralgia ? ?Review of Systems ?Negative Allergic/Immunologic ?Negative Cardiovascular ?Negative Constitutional ?Negative Ear, Nose, Mouth & Throat ?Negative Endocrine ?Negative Eyes ?Negative Gastrointestinal ?Negative Genitourinary ?Negative Hemotologic/Lymphatic ?Negative Integumentary ?Negative Musculoskeletal ?Negative Neurological ?Negative Psychiatry ?Negative Respiratory ? ?Social ?  Smoker, current status unknown  ? ?Medication ?Prednisolone-Moxifloxacin-Bromfenac,  ?Dimethyl Fumarate, Gabapentin, Lamotrigine, Buspirone, Baclofen, Cyclobenzaprine, Fluoxetine, Rosuvastatin, Alprazolam, Hydroxyzine, Ondansetron, Omeprazole,  ? ?Sx/Procedures ?Phaco c IOL OS,  ?Hysterectomy, Tooth extraction, Staples- head,  ? ?Drug Allergies  ?Bees,  Sulfa, Codeine, Pencillin,  ? ?History & Physical: ?Heent: Cataract, Right eye ?NECK: supple without bruits ?LUNGS: lungs clear to auscultation ?CV: regular rate and rhythm ?Abdomen: soft and non-tender ?Impression & Plan: ?Assessment: ?1.  CATARACT EXTRACTION STATUS; Left Eye [phone]) ?2.  NUCLEAR SCLEROSIS AGE RELATED; , Right Eye (H25.11) ? ?Plan: 1.  1 week after cataract surgery. Doing well with improved vision and normal eye pressure. Call with any problems or concerns. ?Continue Pred-Moxi-Brom 2x/day for 3 more weeks. ? ?2.  Cataract accounts for the patient's decreased vision. This visual impairment is not correctable with a tolerable change in glasses or contact lenses. Cataract surgery with an implantation of a new lens should significantly improve the visual and functional status of the patient. Discussed all risks, benefits, alternatives, and potential complications. Discussed the procedures and recovery. Patient desires to have surgery. A-scan ordered and performed today for intra-ocular lens calculations. The surgery will be performed in order to improve vision for driving, reading, and for eye examinations. Recommend phacoemulsification with intra-ocular lens. Recommend Dextenza for post-operative pain and inflammation. ?Right Eye. ?Surgery required to correct imbalance of vision. ?Dilates well - shugarcaine by protocol. ?

## 2021-11-18 ENCOUNTER — Other Ambulatory Visit: Payer: Self-pay

## 2021-11-18 ENCOUNTER — Encounter (HOSPITAL_COMMUNITY): Payer: Self-pay | Admitting: Ophthalmology

## 2021-11-18 ENCOUNTER — Encounter (HOSPITAL_COMMUNITY)
Admission: RE | Disposition: A | Discharge status: Home / Self Care | Payer: Self-pay | Source: Home / Self Care | Attending: Ophthalmology

## 2021-11-18 ENCOUNTER — Ambulatory Visit (HOSPITAL_BASED_OUTPATIENT_CLINIC_OR_DEPARTMENT_OTHER): Payer: PPO | Admitting: Anesthesiology

## 2021-11-18 ENCOUNTER — Ambulatory Visit (HOSPITAL_COMMUNITY)
Admission: RE | Admit: 2021-11-18 | Discharge: 2021-11-18 | Disposition: A | Discharge status: Home / Self Care | Payer: PPO | Attending: Ophthalmology | Admitting: Ophthalmology

## 2021-11-18 ENCOUNTER — Ambulatory Visit (HOSPITAL_COMMUNITY): Payer: PPO | Admitting: Anesthesiology

## 2021-11-18 DIAGNOSIS — F17210 Nicotine dependence, cigarettes, uncomplicated: Secondary | ICD-10-CM | POA: Diagnosis not present

## 2021-11-18 DIAGNOSIS — H2511 Age-related nuclear cataract, right eye: Secondary | ICD-10-CM | POA: Insufficient documentation

## 2021-11-18 DIAGNOSIS — G35 Multiple sclerosis: Secondary | ICD-10-CM

## 2021-11-18 DIAGNOSIS — N289 Disorder of kidney and ureter, unspecified: Secondary | ICD-10-CM | POA: Diagnosis not present

## 2021-11-18 DIAGNOSIS — K219 Gastro-esophageal reflux disease without esophagitis: Secondary | ICD-10-CM | POA: Diagnosis not present

## 2021-11-18 DIAGNOSIS — F418 Other specified anxiety disorders: Secondary | ICD-10-CM | POA: Diagnosis not present

## 2021-11-18 HISTORY — PX: CATARACT EXTRACTION W/PHACO: SHX586

## 2021-11-18 SURGERY — PHACOEMULSIFICATION, CATARACT, WITH IOL INSERTION
Anesthesia: Monitor Anesthesia Care | Site: Eye | Laterality: Right

## 2021-11-18 MED ORDER — BSS IO SOLN
INTRAOCULAR | Status: DC | PRN
  Administered 2021-11-18: 15 mL via INTRAOCULAR

## 2021-11-18 MED ORDER — TETRACAINE HCL 0.5 % OP SOLN
1.0000 [drp] | OPHTHALMIC | Status: AC | PRN
  Administered 2021-11-18 (×3): 1 [drp] via OPHTHALMIC

## 2021-11-18 MED ORDER — MIDAZOLAM HCL 5 MG/5ML IJ SOLN
INTRAMUSCULAR | Status: DC | PRN
  Administered 2021-11-18 (×2): 1 mg via INTRAVENOUS

## 2021-11-18 MED ORDER — MIDAZOLAM HCL 2 MG/2ML IJ SOLN
INTRAMUSCULAR | Status: AC
  Filled 2021-11-18: qty 2

## 2021-11-18 MED ORDER — PHENYLEPHRINE HCL 2.5 % OP SOLN
1.0000 [drp] | OPHTHALMIC | Status: AC | PRN
  Administered 2021-11-18 (×3): 1 [drp] via OPHTHALMIC

## 2021-11-18 MED ORDER — FENTANYL CITRATE (PF) 100 MCG/2ML IJ SOLN
INTRAMUSCULAR | Status: AC
  Filled 2021-11-18: qty 2

## 2021-11-18 MED ORDER — TROPICAMIDE 1 % OP SOLN
1.0000 [drp] | OPHTHALMIC | Status: AC | PRN
  Administered 2021-11-18 (×3): 1 [drp] via OPHTHALMIC

## 2021-11-18 MED ORDER — POVIDONE-IODINE 5 % OP SOLN
OPHTHALMIC | Status: DC | PRN
  Administered 2021-11-18: 1 via OPHTHALMIC

## 2021-11-18 MED ORDER — SODIUM CHLORIDE 0.9% FLUSH
INTRAVENOUS | Status: DC | PRN
  Administered 2021-11-18: 5 mL via INTRAVENOUS

## 2021-11-18 MED ORDER — SODIUM HYALURONATE 10 MG/ML IO SOLUTION
PREFILLED_SYRINGE | INTRAOCULAR | Status: DC | PRN
Start: 2021-11-18 — End: 2021-11-18
  Administered 2021-11-18: 0.85 mL via INTRAOCULAR

## 2021-11-18 MED ORDER — EPINEPHRINE PF 1 MG/ML IJ SOLN
INTRAOCULAR | Status: DC | PRN
  Administered 2021-11-18: 500 mL

## 2021-11-18 MED ORDER — LIDOCAINE HCL (PF) 1 % IJ SOLN
INTRAOCULAR | Status: DC | PRN
  Administered 2021-11-18: 1 mL via OPHTHALMIC

## 2021-11-18 MED ORDER — LIDOCAINE HCL 3.5 % OP GEL: 1.0000 "application " | Freq: Once | OPHTHALMIC | Status: DC

## 2021-11-18 MED ORDER — STERILE WATER FOR IRRIGATION IR SOLN
Status: DC | PRN
  Administered 2021-11-18: 250 mL

## 2021-11-18 MED ORDER — FENTANYL CITRATE (PF) 100 MCG/2ML IJ SOLN
INTRAMUSCULAR | Status: DC | PRN
  Administered 2021-11-18: 50 ug via INTRAVENOUS

## 2021-11-18 MED ORDER — SODIUM HYALURONATE 23MG/ML IO SOSY
PREFILLED_SYRINGE | INTRAOCULAR | Status: DC | PRN
  Administered 2021-11-18: 0.6 mL via INTRAOCULAR

## 2021-11-18 MED ORDER — EPINEPHRINE PF 1 MG/ML IJ SOLN
INTRAMUSCULAR | Status: AC
  Filled 2021-11-18: qty 2

## 2021-11-18 SURGICAL SUPPLY — 19 items
CATARACT SUITE SIGHTPATH (MISCELLANEOUS) ×2 IMPLANT
CLOTH BEACON ORANGE TIMEOUT ST (SAFETY) ×2 IMPLANT
EYE SHIELD UNIVERSAL CLEAR (GAUZE/BANDAGES/DRESSINGS) ×1 IMPLANT
FEE CATARACT SUITE SIGHTPATH (MISCELLANEOUS) ×1 IMPLANT
GLOVE BIOGEL PI IND STRL 7.0 (GLOVE) ×2 IMPLANT
GLOVE BIOGEL PI IND STRL 8 (GLOVE) IMPLANT
GLOVE BIOGEL PI INDICATOR 7.0 (GLOVE) ×2
GLOVE BIOGEL PI INDICATOR 8 (GLOVE) ×1
GOWN STRL REUS W/TWL LRG LVL3 (GOWN DISPOSABLE) ×1 IMPLANT
LENS IOL RAYNER 21.0 (Intraocular Lens) ×2 IMPLANT
LENS IOL RAYONE EMV 21.0 (Intraocular Lens) IMPLANT
NDL HYPO 18GX1.5 BLUNT FILL (NEEDLE) ×1 IMPLANT
NEEDLE HYPO 18GX1.5 BLUNT FILL (NEEDLE) ×2 IMPLANT
PAD ARMBOARD 7.5X6 YLW CONV (MISCELLANEOUS) ×2 IMPLANT
RING MALYGIN 7.0 (MISCELLANEOUS) IMPLANT
SYR TB 1ML LL NO SAFETY (SYRINGE) ×2 IMPLANT
TAPE SURG TRANSPORE 1 IN (GAUZE/BANDAGES/DRESSINGS) IMPLANT
TAPE SURGICAL TRANSPORE 1 IN (GAUZE/BANDAGES/DRESSINGS) ×1
WATER STERILE IRR 250ML POUR (IV SOLUTION) ×2 IMPLANT

## 2021-11-18 NOTE — Interval H&P Note (Signed)
History and Physical Interval Note: ? ?11/18/2021 ?9:43 AM ? ?Megan Chan  has presented today for surgery, with the diagnosis of combined forms age related cataract; right.  The various methods of treatment have been discussed with the patient and family. After consideration of risks, benefits and other options for treatment, the patient has consented to  Procedure(s) with comments: ?CATARACT EXTRACTION PHACO AND INTRAOCULAR LENS PLACEMENT (IOC) (Right) - CDE:  as a surgical intervention.  The patient's history has been reviewed, patient examined, no change in status, stable for surgery.  I have reviewed the patient's chart and labs.  Questions were answered to the patient's satisfaction.   ? ? ?Megan Chan ? ? ?

## 2021-11-18 NOTE — Transfer of Care (Signed)
Immediate Anesthesia Transfer of Care Note ? ?Patient: Megan Chan ? ?Procedure(s) Performed: CATARACT EXTRACTION PHACO AND INTRAOCULAR LENS PLACEMENT (IOC) (Right: Eye) ? ?Patient Location: Short Stay ? ?Anesthesia Type:MAC ? ?Level of Consciousness: awake, alert  and oriented ? ?Airway & Oxygen Therapy: Patient Spontanous Breathing ? ?Post-op Assessment: Report given to RN and Post -op Vital signs reviewed and stable ? ?Post vital signs: Reviewed and stable ? ?Last Vitals:  ?Vitals Value Taken Time  ?BP 117/64 11/18/21 1011  ?Temp 36.6 ?C 11/18/21 1011  ?Pulse 55 11/18/21 1011  ?Resp 15 11/18/21 1011  ?SpO2 95 % 11/18/21 1011  ? ? ?Last Pain:  ?Vitals:  ? 11/18/21 1011  ?TempSrc: Oral  ?PainSc: 0-No pain  ?   ? ?  ? ?Complications: No notable events documented. ?

## 2021-11-18 NOTE — Discharge Instructions (Addendum)
Please discharge patient when stable, will follow up today with Dr. Wrzosek at the [facility] [place] office immediately following discharge.  Leave shield in place until visit.  All paperwork with discharge instructions will be given at the office.  [facility] Address:  [address]  [place], [place] [address]  

## 2021-11-18 NOTE — Anesthesia Postprocedure Evaluation (Signed)
Anesthesia Post Note ? ?Patient: Megan Chan ? ?Procedure(s) Performed: CATARACT EXTRACTION PHACO AND INTRAOCULAR LENS PLACEMENT (IOC) (Right: Eye) ? ?Patient location during evaluation: Phase II ?Anesthesia Type: MAC ?Level of consciousness: awake and alert and oriented ?Pain management: pain level controlled ?Vital Signs Assessment: post-procedure vital signs reviewed and stable ?Respiratory status: spontaneous breathing, nonlabored ventilation and respiratory function stable ?Cardiovascular status: blood pressure returned to baseline and stable ?Postop Assessment: no apparent nausea or vomiting ?Anesthetic complications: no ? ? ?No notable events documented. ? ? ?Last Vitals:  ?Vitals:  ? 11/18/21 0918 11/18/21 1011  ?BP: 106/61 117/64  ?Pulse: (!) 59 (!) 55  ?Resp: 19 15  ?Temp: 36.7 ?C 36.6 ?C  ?SpO2: 98% 95%  ?  ?Last Pain:  ?Vitals:  ? 11/18/21 1011  ?TempSrc: Oral  ?PainSc: 0-No pain  ? ? ?  ?  ?  ?  ?  ?  ? ?Isaiah Torok C Solenne Manwarren ? ? ? ? ?

## 2021-11-18 NOTE — Anesthesia Preprocedure Evaluation (Addendum)
Anesthesia Evaluation  ?Patient identified by MRN, date of birth, ID band ?Patient awake ? ? ? ?Reviewed: ?Allergy & Precautions, NPO status , Patient's Chart, lab work & pertinent test results ? ?Airway ?Mallampati: II ? ?TM Distance: >3 FB ?Neck ROM: Full ? ? ? Dental ? ?(+) Dental Advisory Given, Teeth Intact ?  ?Pulmonary ?Current SmokerPatient did not abstain from smoking.,  ?  ?Pulmonary exam normal ?breath sounds clear to auscultation ? ? ? ? ? ? Cardiovascular ?negative cardio ROS ?Normal cardiovascular exam ?Rhythm:Regular Rate:Normal ? ? ?  ?Neuro/Psych ?PSYCHIATRIC DISORDERS Anxiety Depression  Neuromuscular disease (multiple sclerosis)   ? GI/Hepatic ?Neg liver ROS, GERD  Medicated and Controlled,  ?Endo/Other  ?negative endocrine ROS ? Renal/GU ?Renal disease  ?negative genitourinary ?  ?Musculoskeletal ?negative musculoskeletal ROS ?(+)  ? Abdominal ?  ?Peds ?negative pediatric ROS ?(+)  Hematology ?negative hematology ROS ?(+)   ?Anesthesia Other Findings ? ? Reproductive/Obstetrics ?negative OB ROS ? ?  ? ? ? ? ? ? ? ? ? ? ? ? ? ?  ?  ? ? ? ? ? ? ? ?Anesthesia Physical ?Anesthesia Plan ? ?ASA: 2 ? ?Anesthesia Plan: MAC  ? ?Post-op Pain Management: Minimal or no pain anticipated  ? ?Induction:  ? ?PONV Risk Score and Plan:  ? ?Airway Management Planned: Nasal Cannula and Natural Airway ? ?Additional Equipment:  ? ?Intra-op Plan:  ? ?Post-operative Plan:  ? ?Informed Consent: I have reviewed the patients History and Physical, chart, labs and discussed the procedure including the risks, benefits and alternatives for the proposed anesthesia with the patient or authorized representative who has indicated his/her understanding and acceptance.  ? ? ? ?Dental advisory given ? ?Plan Discussed with: CRNA and Surgeon ? ?Anesthesia Plan Comments:   ? ? ? ? ? ?Anesthesia Quick Evaluation ? ?

## 2021-11-18 NOTE — Op Note (Signed)
Date of procedure: 11/18/21 ? ?Pre-operative diagnosis: Visually significant age-related nuclear cataract, Right Eye (H25.11) ? ?Post-operative diagnosis: Visually significant age-related nuclear cataract, Right Eye ? ?Procedure: Removal of cataract via phacoemulsification and insertion of intra-ocular lens Rayner [id number] +21.0D into the capsular bag of the Right Eye ? ?Attending surgeon: Gerda Diss. Marisa Hua, MD, MA ? ?Anesthesia: MAC, Topical Akten ? ?Complications: None ? ?Estimated Blood Loss: <30m (minimal) ? ?Specimens: None ? ?Implants: As above ? ?Indications:  Visually significant age-related cataract, Right Eye ? ?Procedure:  ?The patient was seen and identified in the pre-operative area. The operative eye was identified and dilated.  The operative eye was marked.  Topical anesthesia was administered to the operative eye.    ? ?The patient was then to the operative suite and placed in the supine position.  A timeout was performed confirming the patient, procedure to be performed, and all other relevant information.   The patient's face was prepped and draped in the usual fashion for intra-ocular surgery.  A lid speculum was placed into the operative eye and the surgical microscope moved into place and focused.  A superotemporal paracentesis was created using a 20 gauge paracentesis blade.  Shugarcaine was injected into the anterior chamber.  Viscoelastic was injected into the anterior chamber.  A temporal clear-corneal main wound incision was created using a 2.446mmicrokeratome.  A continuous curvilinear capsulorrhexis was initiated using an irrigating cystitome and completed using capsulorrhexis forceps.  Hydrodissection and hydrodeliniation were performed.  Viscoelastic was injected into the anterior chamber.  A phacoemulsification handpiece and a chopper as a second instrument were used to remove the nucleus and epinucleus. The irrigation/aspiration handpiece was used to remove any remaining cortical  material.  ? ?The capsular bag was reinflated with viscoelastic, checked, and found to be intact.  The intraocular lens was inserted into the capsular bag.  The irrigation/aspiration handpiece was used to remove any remaining viscoelastic.  The clear corneal wound and paracentesis wounds were then hydrated and checked with Weck-Cels to be watertight.  The lid-speculum and drape was removed, and the patient's face was cleaned with a wet and dry 4x4.  A clear shield was taped over the eye. The patient was taken to the post-operative care unit in good condition, having tolerated the procedure well. ? ?Post-Op Instructions: [facility] and will receive all other orders and instructions. ? ?

## 2021-11-21 ENCOUNTER — Encounter (HOSPITAL_COMMUNITY): Payer: Self-pay | Admitting: Ophthalmology

## 2021-12-14 ENCOUNTER — Other Ambulatory Visit: Payer: Self-pay | Admitting: Neurology

## 2022-01-20 DIAGNOSIS — Z1231 Encounter for screening mammogram for malignant neoplasm of breast: Secondary | ICD-10-CM | POA: Diagnosis not present

## 2022-01-24 ENCOUNTER — Encounter: Payer: Self-pay | Admitting: Neurology

## 2022-01-24 ENCOUNTER — Ambulatory Visit: Payer: PPO | Admitting: Neurology

## 2022-01-24 VITALS — BP 106/55 | HR 66 | Ht 62.5 in | Wt 131.5 lb

## 2022-01-24 DIAGNOSIS — R200 Anesthesia of skin: Secondary | ICD-10-CM | POA: Diagnosis not present

## 2022-01-24 DIAGNOSIS — Z79899 Other long term (current) drug therapy: Secondary | ICD-10-CM

## 2022-01-24 DIAGNOSIS — G35 Multiple sclerosis: Secondary | ICD-10-CM

## 2022-01-24 DIAGNOSIS — R202 Paresthesia of skin: Secondary | ICD-10-CM | POA: Diagnosis not present

## 2022-01-24 DIAGNOSIS — R261 Paralytic gait: Secondary | ICD-10-CM

## 2022-01-24 DIAGNOSIS — G501 Atypical facial pain: Secondary | ICD-10-CM | POA: Diagnosis not present

## 2022-01-24 MED ORDER — BACLOFEN 10 MG PO TABS
10.0000 mg | ORAL_TABLET | Freq: Three times a day (TID) | ORAL | 4 refills | Status: DC
Start: 2022-01-24 — End: 2023-04-02

## 2022-01-24 MED ORDER — LAMOTRIGINE 200 MG PO TABS: 200.0000 mg | ORAL_TABLET | Freq: Two times a day (BID) | ORAL | 4 refills | Status: DC
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
## 2022-01-25 LAB — CBC WITH DIFFERENTIAL/PLATELET
Basophils Absolute: 0 10*3/uL (ref 0.0–0.2)
Basos: 0 %
EOS (ABSOLUTE): 0.1 10*3/uL (ref 0.0–0.4)
Eos: 1 %
Hematocrit: 40.9 % (ref 34.0–46.6)
Hemoglobin: 13.9 g/dL (ref 11.1–15.9)
Immature Grans (Abs): 0 10*3/uL (ref 0.0–0.1)
Immature Granulocytes: 0 %
Lymphocytes Absolute: 1.7 10*3/uL (ref 0.7–3.1)
Lymphs: 25 %
MCH: 31.4 pg (ref 26.6–33.0)
MCHC: 34 g/dL (ref 31.5–35.7)
MCV: 93 fL (ref 79–97)
Monocytes Absolute: 0.5 10*3/uL (ref 0.1–0.9)
Monocytes: 8 %
Neutrophils Absolute: 4.3 10*3/uL (ref 1.4–7.0)
Neutrophils: 66 %
Platelets: 213 10*3/uL (ref 150–450)
RBC: 4.42 x10E6/uL (ref 3.77–5.28)
RDW: 12.7 % (ref 11.7–15.4)
WBC: 6.7 10*3/uL (ref 3.4–10.8)

## 2022-01-25 LAB — HEPATIC FUNCTION PANEL
ALT: 95 IU/L — ABNORMAL HIGH (ref 0–32)
AST: 41 IU/L — ABNORMAL HIGH (ref 0–40)
Albumin: 4.6 g/dL (ref 3.8–4.9)
Alkaline Phosphatase: 209 IU/L — ABNORMAL HIGH (ref 44–121)
Bilirubin Total: 0.4 mg/dL (ref 0.0–1.2)
Bilirubin, Direct: 0.13 mg/dL (ref 0.00–0.40)
Total Protein: 6.6 g/dL (ref 6.0–8.5)

## 2022-01-26 ENCOUNTER — Telehealth: Payer: Self-pay | Admitting: Neurology

## 2022-01-26 ENCOUNTER — Telehealth: Payer: Self-pay | Admitting: *Deleted

## 2022-01-26 ENCOUNTER — Other Ambulatory Visit: Payer: Self-pay | Admitting: Neurology

## 2022-01-26 DIAGNOSIS — G35 Multiple sclerosis: Secondary | ICD-10-CM

## 2022-01-26 DIAGNOSIS — Z79899 Other long term (current) drug therapy: Secondary | ICD-10-CM

## 2022-01-26 NOTE — Telephone Encounter (Signed)
-----   Message from Britt Bottom, MD sent at 01/25/2022  4:48 PM EDT ----- Please let her know that the liver tests was mildly elevated.  If she takes Tylenol or drinks much alcohol she should try to reduce these as much as possible as these often affect the liver.  Vumerity occasionally can cause the liver enzymes to be elevated.  Also, sometimes these can be elevated with a virus.  I would like to recheck the liver tests in about 4 to 6 weeks.  If they are not improved we may need to consider a different medicine

## 2022-01-26 NOTE — Telephone Encounter (Signed)
LVM asking for pt to call back about results.

## 2022-01-26 NOTE — Addendum Note (Signed)
Addended by: Darleen Crocker on: 01/26/2022 11:13 AM   Modules accepted: Orders

## 2022-01-26 NOTE — Telephone Encounter (Signed)
Called the patient and reviewed the lab results. Advised the patient that the liver enzymes were elevated. Informed the patient that if she is taking tylenol or drinks alcohol she should reduce intake. Pt states that she will have her labs rechecked around the 4-6 weeks time frame at her pcp office. She asked would the labs be in and I informed her we would have them in and release them so that they should be able to see them. Pt verbalized understanding. Pt had no questions at this time but was encouraged to call back if questions arise.

## 2022-03-06 DIAGNOSIS — R945 Abnormal results of liver function studies: Secondary | ICD-10-CM | POA: Diagnosis not present

## 2022-03-21 DIAGNOSIS — E559 Vitamin D deficiency, unspecified: Secondary | ICD-10-CM | POA: Diagnosis not present

## 2022-03-21 DIAGNOSIS — R5383 Other fatigue: Secondary | ICD-10-CM | POA: Diagnosis not present

## 2022-03-21 DIAGNOSIS — E7801 Familial hypercholesterolemia: Secondary | ICD-10-CM | POA: Diagnosis not present

## 2022-03-21 DIAGNOSIS — E7849 Other hyperlipidemia: Secondary | ICD-10-CM | POA: Diagnosis not present

## 2022-03-21 DIAGNOSIS — Z79899 Other long term (current) drug therapy: Secondary | ICD-10-CM | POA: Diagnosis not present

## 2022-03-21 DIAGNOSIS — D519 Vitamin B12 deficiency anemia, unspecified: Secondary | ICD-10-CM | POA: Diagnosis not present

## 2022-03-21 DIAGNOSIS — E119 Type 2 diabetes mellitus without complications: Secondary | ICD-10-CM | POA: Diagnosis not present

## 2022-03-23 DIAGNOSIS — F4001 Agoraphobia with panic disorder: Secondary | ICD-10-CM | POA: Diagnosis not present

## 2022-03-23 DIAGNOSIS — R911 Solitary pulmonary nodule: Secondary | ICD-10-CM | POA: Diagnosis not present

## 2022-03-23 DIAGNOSIS — D519 Vitamin B12 deficiency anemia, unspecified: Secondary | ICD-10-CM | POA: Diagnosis not present

## 2022-03-23 DIAGNOSIS — F324 Major depressive disorder, single episode, in partial remission: Secondary | ICD-10-CM | POA: Diagnosis not present

## 2022-03-23 DIAGNOSIS — G500 Trigeminal neuralgia: Secondary | ICD-10-CM | POA: Diagnosis not present

## 2022-03-23 DIAGNOSIS — F411 Generalized anxiety disorder: Secondary | ICD-10-CM | POA: Diagnosis not present

## 2022-03-23 DIAGNOSIS — E559 Vitamin D deficiency, unspecified: Secondary | ICD-10-CM | POA: Diagnosis not present

## 2022-03-23 DIAGNOSIS — N951 Menopausal and female climacteric states: Secondary | ICD-10-CM | POA: Diagnosis not present

## 2022-03-23 DIAGNOSIS — G35 Multiple sclerosis: Secondary | ICD-10-CM | POA: Diagnosis not present

## 2022-03-23 DIAGNOSIS — R7989 Other specified abnormal findings of blood chemistry: Secondary | ICD-10-CM | POA: Diagnosis not present

## 2022-03-23 DIAGNOSIS — E7849 Other hyperlipidemia: Secondary | ICD-10-CM | POA: Diagnosis not present

## 2022-03-23 DIAGNOSIS — Z0001 Encounter for general adult medical examination with abnormal findings: Secondary | ICD-10-CM | POA: Diagnosis not present

## 2022-04-05 ENCOUNTER — Encounter (HOSPITAL_COMMUNITY): Payer: Self-pay | Admitting: *Deleted

## 2022-04-05 ENCOUNTER — Emergency Department (HOSPITAL_COMMUNITY)
Admission: EM | Admit: 2022-04-05 | Discharge: 2022-04-05 | Disposition: A | Discharge status: Home / Self Care | Payer: PPO | Attending: Emergency Medicine | Admitting: Emergency Medicine

## 2022-04-05 ENCOUNTER — Other Ambulatory Visit: Payer: Self-pay

## 2022-04-05 ENCOUNTER — Emergency Department (HOSPITAL_COMMUNITY): Payer: PPO

## 2022-04-05 ENCOUNTER — Telehealth: Payer: Self-pay

## 2022-04-05 DIAGNOSIS — W1839XA Other fall on same level, initial encounter: Secondary | ICD-10-CM | POA: Insufficient documentation

## 2022-04-05 DIAGNOSIS — S42211A Unspecified displaced fracture of surgical neck of right humerus, initial encounter for closed fracture: Secondary | ICD-10-CM | POA: Diagnosis not present

## 2022-04-05 DIAGNOSIS — S50319A Abrasion of unspecified elbow, initial encounter: Secondary | ICD-10-CM | POA: Diagnosis not present

## 2022-04-05 DIAGNOSIS — F17200 Nicotine dependence, unspecified, uncomplicated: Secondary | ICD-10-CM | POA: Diagnosis not present

## 2022-04-05 DIAGNOSIS — M25531 Pain in right wrist: Secondary | ICD-10-CM | POA: Diagnosis not present

## 2022-04-05 DIAGNOSIS — S80211A Abrasion, right knee, initial encounter: Secondary | ICD-10-CM | POA: Diagnosis not present

## 2022-04-05 DIAGNOSIS — M19041 Primary osteoarthritis, right hand: Secondary | ICD-10-CM | POA: Diagnosis not present

## 2022-04-05 DIAGNOSIS — S80212A Abrasion, left knee, initial encounter: Secondary | ICD-10-CM | POA: Insufficient documentation

## 2022-04-05 DIAGNOSIS — S42201A Unspecified fracture of upper end of right humerus, initial encounter for closed fracture: Secondary | ICD-10-CM | POA: Diagnosis not present

## 2022-04-05 DIAGNOSIS — S59911A Unspecified injury of right forearm, initial encounter: Secondary | ICD-10-CM | POA: Diagnosis not present

## 2022-04-05 DIAGNOSIS — M79601 Pain in right arm: Secondary | ICD-10-CM | POA: Diagnosis present

## 2022-04-05 DIAGNOSIS — M19011 Primary osteoarthritis, right shoulder: Secondary | ICD-10-CM | POA: Diagnosis not present

## 2022-04-05 DIAGNOSIS — W19XXXA Unspecified fall, initial encounter: Secondary | ICD-10-CM

## 2022-04-05 MED ORDER — HYDROCODONE-ACETAMINOPHEN 5-325 MG PO TABS: 1.0000 | ORAL_TABLET | Freq: Four times a day (QID) | ORAL | 0 refills | Status: DC | PRN

## 2022-04-05 MED ORDER — HYDROMORPHONE HCL 1 MG/ML IJ SOLN
1.0000 mg | Freq: Once | INTRAMUSCULAR | Status: AC
Start: 2022-04-05 — End: 2022-04-05
  Administered 2022-04-05: 1 mg via INTRAVENOUS
  Filled 2022-04-05: qty 1

## 2022-04-05 MED ORDER — ONDANSETRON HCL 4 MG/2ML IJ SOLN
4.0000 mg | Freq: Once | INTRAMUSCULAR | Status: AC
  Administered 2022-04-05: 4 mg via INTRAVENOUS
  Filled 2022-04-05: qty 2

## 2022-04-05 MED ORDER — HYDROMORPHONE HCL 1 MG/ML IJ SOLN
1.0000 mg | Freq: Once | INTRAMUSCULAR | Status: AC
  Administered 2022-04-05: 1 mg via INTRAVENOUS
  Filled 2022-04-05: qty 1

## 2022-04-05 NOTE — ED Notes (Signed)
Pts SpO2 in high 80s to low 90s. Placed pt on 2 L via [place] before administering pain medication. SpO2 is now at 97% 2 L

## 2022-04-05 NOTE — ED Triage Notes (Signed)
Pt states she fell this morning landing on her right arm; pt c/o pain from shoulder to wrist and pt unable to lift arm

## 2022-04-05 NOTE — ED Provider Notes (Addendum)
[facility] EMERGENCY DEPARTMENT Provider Note   CSN: [id number] Arrival date & time: 04/05/22  1012     History  Chief Complaint  Patient presents with   Lytle Michaels    Megan Chan is a 56 y.o. female.  Patient with a fall this morning on concrete.  No loss of consciousness.  Lots of pain to her right arm not able to localize it says it hurts from the shoulder down all the way to the fingers.  Patient has a history of MS so sometimes she has numbness intermittently to her right hand she has some now.  Does have some superficial abrasions to her knees but not worried about her lower extremities.  Denies any posterior neck pain thoracic back pain or lumbar back pain.  Past medical history significant for history of MS.  Trigeminal neuralgia high cholesterol.  Past surgical history significant for abdominal hysterectomy patient is an everyday smoker.       Home Medications Prior to Admission medications   Medication Sig Start Date End Date Taking? Authorizing Provider  ALPRAZolam Duanne Moron) 0.5 MG tablet Take 0.5 mg by mouth 2 (two) times daily as needed (for major panic attack).    Yes [provider]  busPIRone (BUSPAR) 15 MG tablet Take 1 tablet by mouth twice daily 12/14/21  Yes Sater, Nanine Means, MD  Dimethyl Fumarate 240 MG CPDR Take 1 capsule by mouth twice per day 09/29/21  Yes Sater, Nanine Means, MD  FLUoxetine (PROZAC) 20 MG capsule Take 60 mg by mouth daily.    Yes [provider]  gabapentin (NEURONTIN) 600 MG tablet Take 1 tablet by mouth 4 times daily 12/14/21  Yes Sater, Nanine Means, MD  hydrOXYzine (ATARAX) 10 MG tablet TAKE 1 TABLET BY MOUTH AT BEDTIME 12/14/21  Yes Sater, Nanine Means, MD  lamoTRIgine (LAMICTAL) 200 MG tablet Take 1 tablet (200 mg total) by mouth 2 (two) times daily. 01/24/22  Yes Sater, Nanine Means, MD  omeprazole (PRILOSEC) 40 MG capsule Take 40 mg by mouth 2 (two) times daily. 06/19/21  Yes [provider]  rosuvastatin (CRESTOR) 10  MG tablet Take 10 mg by mouth at bedtime.    Yes [provider]  baclofen (LIORESAL) 10 MG tablet Take 1 tablet (10 mg total) by mouth 3 (three) times daily. Patient not taking: Reported on 04/05/2022 01/24/22   Britt Bottom, MD  ondansetron (ZOFRAN ODT) 4 MG disintegrating tablet Take 1 tablet (4 mg total) by mouth every 8 (eight) hours as needed for nausea or vomiting. Patient not taking: Reported on 04/05/2022 08/18/20   Rogene Houston, MD      Allergies    Codeine, Sulfa antibiotics, and Penicillins    Review of Systems   Review of Systems  Constitutional:  Negative for chills and fever.  HENT:  Negative for ear pain and sore throat.   Eyes:  Negative for pain and visual disturbance.  Respiratory:  Negative for cough and shortness of breath.   Cardiovascular:  Negative for chest pain and palpitations.  Gastrointestinal:  Negative for abdominal pain and vomiting.  Genitourinary:  Negative for dysuria and hematuria.  Musculoskeletal:  Negative for arthralgias and back pain.  Skin:  Negative for color change and rash.  Neurological:  Negative for seizures and syncope.  All other systems reviewed and are negative.   Physical Exam Updated Vital Signs BP 124/63   Pulse 77   Temp (!) 97.5 F (36.4 C) (Oral)   Resp 20  Ht 1.588 m (5' 2.5")   Wt 59 kg   SpO2 96%   BMI 23.40 kg/m  Physical Exam Vitals and nursing note reviewed.  Constitutional:      General: She is not in acute distress.    Appearance: Normal appearance. She is well-developed.  HENT:     Head: Normocephalic and atraumatic.  Eyes:     Extraocular Movements: Extraocular movements intact.     Conjunctiva/sclera: Conjunctivae normal.     Pupils: Pupils are equal, round, and reactive to light.  Cardiovascular:     Rate and Rhythm: Normal rate and regular rhythm.     Heart sounds: No murmur heard. Pulmonary:     Effort: Pulmonary effort is normal. No respiratory distress.     Breath sounds: Normal  breath sounds.  Abdominal:     Palpations: Abdomen is soft.     Tenderness: There is no abdominal tenderness.  Musculoskeletal:        General: Tenderness and signs of injury present. No swelling or deformity.     Cervical back: Normal range of motion and neck supple.     Comments: Tenderness to palpation from the right shoulder down.  No obvious shoulder deformity no clavicle deformity no humerus deformity no elbow deformity but there is some abrasions on the elbow.  Also tender to palpation to the forearm wrist and fingers.  But no obvious deformity.  Radial pulses 2+.  Decreased sensation to the right hand but she says that is baseline for her MS.  Superficial abrasions to both knees.  No effusion no patellar dislocation.  Skin:    General: Skin is warm and dry.     Capillary Refill: Capillary refill takes less than 2 seconds.  Neurological:     General: No focal deficit present.     Mental Status: She is alert and oriented to person, place, and time.     Cranial Nerves: No cranial nerve deficit.     Sensory: Sensory deficit present.     Motor: No weakness.  Psychiatric:        Mood and Affect: Mood normal.     ED Results / Procedures / Treatments   Labs (all labs ordered are listed, but only abnormal results are displayed) Labs Reviewed - No data to display  EKG None  Radiology DG Humerus Right  Result Date: 04/05/2022 CLINICAL DATA:  Fall, pain. EXAM: RIGHT SHOULDER - 2+ VIEW; RIGHT HUMERUS - 2+ VIEW; RIGHT FOREARM - 2 VIEW RIGHT WRIST - COMPLETE 3+ VIEW; RIGHT HAND - COMPLETE 3+ VIEW; COMPARISON:  None Available. FINDINGS: Right shoulder: There is a displaced impaction fracture of the surgical neck of the right humerus. Moderate acromioclavicular osteoarthritis. Right humerus: No distal humeral fracture. Right forearm: Radius and ulna are intact. No significant soft tissue abnormality. Right wrist: No fracture or dislocation. No significant soft tissue abnormality. Right hand:  No acute fracture or dislocation. Mild arthritic changes of the interphalangeal joints. IMPRESSION: 1. Displaced impaction fracture of the surgical neck of the right humerus. 2. No other appreciable fracture or dislocation of the imaged right upper extremity. Electronically Signed   By: Keane Police D.O.   On: 04/05/2022 11:58   DG Shoulder Right  Result Date: 04/05/2022 CLINICAL DATA:  Fall, pain. EXAM: RIGHT SHOULDER - 2+ VIEW; RIGHT HUMERUS - 2+ VIEW; RIGHT FOREARM - 2 VIEW RIGHT WRIST - COMPLETE 3+ VIEW; RIGHT HAND - COMPLETE 3+ VIEW; COMPARISON:  None Available. FINDINGS: Right shoulder: There is a displaced  impaction fracture of the surgical neck of the right humerus. Moderate acromioclavicular osteoarthritis. Right humerus: No distal humeral fracture. Right forearm: Radius and ulna are intact. No significant soft tissue abnormality. Right wrist: No fracture or dislocation. No significant soft tissue abnormality. Right hand: No acute fracture or dislocation. Mild arthritic changes of the interphalangeal joints. IMPRESSION: 1. Displaced impaction fracture of the surgical neck of the right humerus. 2. No other appreciable fracture or dislocation of the imaged right upper extremity. Electronically Signed   By: Keane Police D.O.   On: 04/05/2022 11:58   DG Wrist Complete Right  Result Date: 04/05/2022 CLINICAL DATA:  Fall, pain. EXAM: RIGHT SHOULDER - 2+ VIEW; RIGHT HUMERUS - 2+ VIEW; RIGHT FOREARM - 2 VIEW RIGHT WRIST - COMPLETE 3+ VIEW; RIGHT HAND - COMPLETE 3+ VIEW; COMPARISON:  None Available. FINDINGS: Right shoulder: There is a displaced impaction fracture of the surgical neck of the right humerus. Moderate acromioclavicular osteoarthritis. Right humerus: No distal humeral fracture. Right forearm: Radius and ulna are intact. No significant soft tissue abnormality. Right wrist: No fracture or dislocation. No significant soft tissue abnormality. Right hand: No acute fracture or dislocation. Mild  arthritic changes of the interphalangeal joints. IMPRESSION: 1. Displaced impaction fracture of the surgical neck of the right humerus. 2. No other appreciable fracture or dislocation of the imaged right upper extremity. Electronically Signed   By: Keane Police D.O.   On: 04/05/2022 11:58   DG Hand Complete Right  Result Date: 04/05/2022 CLINICAL DATA:  Fall, pain. EXAM: RIGHT SHOULDER - 2+ VIEW; RIGHT HUMERUS - 2+ VIEW; RIGHT FOREARM - 2 VIEW RIGHT WRIST - COMPLETE 3+ VIEW; RIGHT HAND - COMPLETE 3+ VIEW; COMPARISON:  None Available. FINDINGS: Right shoulder: There is a displaced impaction fracture of the surgical neck of the right humerus. Moderate acromioclavicular osteoarthritis. Right humerus: No distal humeral fracture. Right forearm: Radius and ulna are intact. No significant soft tissue abnormality. Right wrist: No fracture or dislocation. No significant soft tissue abnormality. Right hand: No acute fracture or dislocation. Mild arthritic changes of the interphalangeal joints. IMPRESSION: 1. Displaced impaction fracture of the surgical neck of the right humerus. 2. No other appreciable fracture or dislocation of the imaged right upper extremity. Electronically Signed   By: Keane Police D.O.   On: 04/05/2022 11:58   DG Forearm Right  Result Date: 04/05/2022 CLINICAL DATA:  Fall, pain. EXAM: RIGHT SHOULDER - 2+ VIEW; RIGHT HUMERUS - 2+ VIEW; RIGHT FOREARM - 2 VIEW RIGHT WRIST - COMPLETE 3+ VIEW; RIGHT HAND - COMPLETE 3+ VIEW; COMPARISON:  None Available. FINDINGS: Right shoulder: There is a displaced impaction fracture of the surgical neck of the right humerus. Moderate acromioclavicular osteoarthritis. Right humerus: No distal humeral fracture. Right forearm: Radius and ulna are intact. No significant soft tissue abnormality. Right wrist: No fracture or dislocation. No significant soft tissue abnormality. Right hand: No acute fracture or dislocation. Mild arthritic changes of the interphalangeal  joints. IMPRESSION: 1. Displaced impaction fracture of the surgical neck of the right humerus. 2. No other appreciable fracture or dislocation of the imaged right upper extremity. Electronically Signed   By: Keane Police D.O.   On: 04/05/2022 11:58    Procedures Procedures    Medications Ordered in ED Medications  ondansetron (ZOFRAN) injection 4 mg (4 mg Intravenous Given 04/05/22 1050)  HYDROmorphone (DILAUDID) injection 1 mg (1 mg Intravenous Given 04/05/22 1052)  HYDROmorphone (DILAUDID) injection 1 mg (1 mg Intravenous Given 04/05/22 1206)    ED  Course/ Medical Decision Making/ A&P                           Medical Decision Making Amount and/or Complexity of Data Reviewed Radiology: ordered.  Risk Prescription drug management.  Patient with fall has a displaced proximal humerus fracture at the surgical neck.  Will treat with shoulder immobilizer and have her follow-up with orthopedics and pain medication.  No evidence of any other injuries to the right upper extremity.  Final Clinical Impression(s) / ED Diagnoses Final diagnoses:  Fall, initial encounter  Closed fracture of proximal end of right humerus, unspecified fracture morphology, initial encounter    Rx / DC Orders ED Discharge Orders     None         Fredia Sorrow, MD 04/05/22 1119    Fredia Sorrow, MD 04/05/22 1207

## 2022-04-05 NOTE — Discharge Instructions (Signed)
X-rays show proximal humerus fracture.  Wear the shoulder immobilizer at all times.  Can remove it to shower.  Make an appointment to follow-up with orthopedics.  The pain medication as directed.

## 2022-04-05 NOTE — ED Notes (Signed)
Patient transported to X-ray 

## 2022-04-06 ENCOUNTER — Ambulatory Visit (INDEPENDENT_AMBULATORY_CARE_PROVIDER_SITE_OTHER): Payer: PPO | Admitting: Orthopaedic Surgery

## 2022-04-06 ENCOUNTER — Ambulatory Visit: Payer: PPO | Admitting: Orthopedic Surgery

## 2022-04-06 ENCOUNTER — Encounter: Payer: Self-pay | Admitting: Orthopaedic Surgery

## 2022-04-06 DIAGNOSIS — S42209A Unspecified fracture of upper end of unspecified humerus, initial encounter for closed fracture: Secondary | ICD-10-CM | POA: Insufficient documentation

## 2022-04-06 DIAGNOSIS — S42201A Unspecified fracture of upper end of right humerus, initial encounter for closed fracture: Secondary | ICD-10-CM

## 2022-04-06 MED ORDER — HYDROCODONE-ACETAMINOPHEN 5-325 MG PO TABS: 1.0000 | ORAL_TABLET | Freq: Four times a day (QID) | ORAL | 0 refills | Status: DC | PRN

## 2022-04-06 NOTE — Progress Notes (Signed)
Office Visit Note   Patient: Megan Chan           Date of Birth: 11/02/1965           MRN: [id number] Visit Date: 04/06/2022              Requested by: Sasser, Silvestre Moment, MD [place],  [place] [id number] PCP: Manon Hilding, MD   Assessment & Plan: Visit Diagnoses:  1. Closed fracture of proximal end of right humerus, unspecified fracture morphology, initial encounter     Plan: We will have her see Dr. Marlou Sa this afternoon for scheduling of ORIF proximal humerus fracture right shoulder.  I sent in a prescription for Norco 5/325 1-2 p.o. every 6 hours as needed pain since she was only given a small amount from the emergency room.  Follow-Up Instructions: No follow-ups on file.   Orders:  No orders of the defined types were placed in this encounter.  No orders of the defined types were placed in this encounter.     Procedures: No procedures performed   Clinical Data: No additional findings.   Subjective: Chief Complaint  Patient presents with   Right Upper Arm - Fracture    Fall 04/05/2022    HPI 56 year old female with history of MS, smoker, fell yesterday on concrete with immediate sharp right shoulder pain.  She has longstanding history of trigeminal neuralgia for many years and takes Xanax occasionally, but has also been on other medications increase fall risk including Neurontin Atarax, Lamictal all for her trigeminal neuralgia problem.  Patient is in shoulder immobilizer.  She suffered a proximal humerus fracture, closed on the right.  X-rays of her wrist and hand on the right side were negative.  No injury to the elbow.  She was given Norco 1 p.o. every 6  hrs 5/325 dose and states is not really doing the trick but she did sleep a few hours last night.  Review of Systems positive for trigeminal neuralgia on multi medications also history of MS.   Objective: Vital Signs: Ht '5\' 2"'$  (1.575 m)   Wt 130 lb (59 kg)   BMI 23.78 kg/m   Physical  Exam Constitutional:      Appearance: She is well-developed.  HENT:     Head: Normocephalic.     Right Ear: External ear normal.     Left Ear: External ear normal. There is no impacted cerumen.  Eyes:     Pupils: Pupils are equal, round, and reactive to light.  Neck:     Thyroid: No thyromegaly.     Trachea: No tracheal deviation.  Cardiovascular:     Rate and Rhythm: Normal rate.  Pulmonary:     Effort: Pulmonary effort is normal.  Abdominal:     Palpations: Abdomen is soft.  Musculoskeletal:     Cervical back: No rigidity.  Skin:    General: Skin is warm and dry.  Neurological:     Mental Status: She is alert and oriented to person, place, and time.  Psychiatric:        Behavior: Behavior normal.     Ortho Exam axillary sensation laterally over the deltoid is intact she has some ecchymosis mid humerus.  Good range of motion of the fingers and hands with good sensation.  Specialty Comments:  No specialty comments available.  Imaging: DG Humerus Right  Result Date: 04/05/2022 CLINICAL DATA:  Fall, pain. EXAM: RIGHT SHOULDER - 2+ VIEW; RIGHT HUMERUS - 2+ VIEW;  RIGHT FOREARM - 2 VIEW RIGHT WRIST - COMPLETE 3+ VIEW; RIGHT HAND - COMPLETE 3+ VIEW; COMPARISON:  None Available. FINDINGS: Right shoulder: There is a displaced impaction fracture of the surgical neck of the right humerus. Moderate acromioclavicular osteoarthritis. Right humerus: No distal humeral fracture. Right forearm: Radius and ulna are intact. No significant soft tissue abnormality. Right wrist: No fracture or dislocation. No significant soft tissue abnormality. Right hand: No acute fracture or dislocation. Mild arthritic changes of the interphalangeal joints. IMPRESSION: 1. Displaced impaction fracture of the surgical neck of the right humerus. 2. No other appreciable fracture or dislocation of the imaged right upper extremity. Electronically Signed   By: Keane Police D.O.   On: 04/05/2022 11:58   DG Shoulder  Right  Result Date: 04/05/2022 CLINICAL DATA:  Fall, pain. EXAM: RIGHT SHOULDER - 2+ VIEW; RIGHT HUMERUS - 2+ VIEW; RIGHT FOREARM - 2 VIEW RIGHT WRIST - COMPLETE 3+ VIEW; RIGHT HAND - COMPLETE 3+ VIEW; COMPARISON:  None Available. FINDINGS: Right shoulder: There is a displaced impaction fracture of the surgical neck of the right humerus. Moderate acromioclavicular osteoarthritis. Right humerus: No distal humeral fracture. Right forearm: Radius and ulna are intact. No significant soft tissue abnormality. Right wrist: No fracture or dislocation. No significant soft tissue abnormality. Right hand: No acute fracture or dislocation. Mild arthritic changes of the interphalangeal joints. IMPRESSION: 1. Displaced impaction fracture of the surgical neck of the right humerus. 2. No other appreciable fracture or dislocation of the imaged right upper extremity. Electronically Signed   By: Keane Police D.O.   On: 04/05/2022 11:58   DG Wrist Complete Right  Result Date: 04/05/2022 CLINICAL DATA:  Fall, pain. EXAM: RIGHT SHOULDER - 2+ VIEW; RIGHT HUMERUS - 2+ VIEW; RIGHT FOREARM - 2 VIEW RIGHT WRIST - COMPLETE 3+ VIEW; RIGHT HAND - COMPLETE 3+ VIEW; COMPARISON:  None Available. FINDINGS: Right shoulder: There is a displaced impaction fracture of the surgical neck of the right humerus. Moderate acromioclavicular osteoarthritis. Right humerus: No distal humeral fracture. Right forearm: Radius and ulna are intact. No significant soft tissue abnormality. Right wrist: No fracture or dislocation. No significant soft tissue abnormality. Right hand: No acute fracture or dislocation. Mild arthritic changes of the interphalangeal joints. IMPRESSION: 1. Displaced impaction fracture of the surgical neck of the right humerus. 2. No other appreciable fracture or dislocation of the imaged right upper extremity. Electronically Signed   By: Keane Police D.O.   On: 04/05/2022 11:58   DG Hand Complete Right  Result Date:  04/05/2022 CLINICAL DATA:  Fall, pain. EXAM: RIGHT SHOULDER - 2+ VIEW; RIGHT HUMERUS - 2+ VIEW; RIGHT FOREARM - 2 VIEW RIGHT WRIST - COMPLETE 3+ VIEW; RIGHT HAND - COMPLETE 3+ VIEW; COMPARISON:  None Available. FINDINGS: Right shoulder: There is a displaced impaction fracture of the surgical neck of the right humerus. Moderate acromioclavicular osteoarthritis. Right humerus: No distal humeral fracture. Right forearm: Radius and ulna are intact. No significant soft tissue abnormality. Right wrist: No fracture or dislocation. No significant soft tissue abnormality. Right hand: No acute fracture or dislocation. Mild arthritic changes of the interphalangeal joints. IMPRESSION: 1. Displaced impaction fracture of the surgical neck of the right humerus. 2. No other appreciable fracture or dislocation of the imaged right upper extremity. Electronically Signed   By: Keane Police D.O.   On: 04/05/2022 11:58   DG Forearm Right  Result Date: 04/05/2022 CLINICAL DATA:  Fall, pain. EXAM: RIGHT SHOULDER - 2+ VIEW; RIGHT HUMERUS - 2+ VIEW;  RIGHT FOREARM - 2 VIEW RIGHT WRIST - COMPLETE 3+ VIEW; RIGHT HAND - COMPLETE 3+ VIEW; COMPARISON:  None Available. FINDINGS: Right shoulder: There is a displaced impaction fracture of the surgical neck of the right humerus. Moderate acromioclavicular osteoarthritis. Right humerus: No distal humeral fracture. Right forearm: Radius and ulna are intact. No significant soft tissue abnormality. Right wrist: No fracture or dislocation. No significant soft tissue abnormality. Right hand: No acute fracture or dislocation. Mild arthritic changes of the interphalangeal joints. IMPRESSION: 1. Displaced impaction fracture of the surgical neck of the right humerus. 2. No other appreciable fracture or dislocation of the imaged right upper extremity. Electronically Signed   By: Keane Police D.O.   On: 04/05/2022 11:58     PMFS History: Patient Active Problem List   Diagnosis Date Noted   Proximal  humerus fracture 04/06/2022   Insomnia 10/24/2017   Low vitamin D level 10/24/2017   Numbness 01/15/2017   History of colonic polyps    Colon adenomas 11/08/2016   Right sided abdominal pain 11/08/2016   Tick bite 01/26/2016   High risk medication use 11/24/2014   Neck pain 11/24/2014   Multiple sclerosis ([place]) 08/25/2014   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   Other fatigue 08/25/2014   Depression with anxiety 08/25/2014   Urinary frequency 08/25/2014   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/2014   Past Medical History:  Diagnosis Date   Anxiety    Depression    GERD (gastroesophageal reflux disease)    Hypercholesterolemia    Memory loss    MS (multiple sclerosis) (HCC)    Multiple sclerosis (HCC)    Renal disorder    Trigeminal neuralgia    Trigeminal neuralgia    Vision abnormalities     Family History  Problem Relation Age of Onset   Stroke Mother    Parkinsonism Mother    Colon cancer Mother 61   Alzheimer's disease Father     Past Surgical History:  Procedure Laterality Date   ABDOMINAL HYSTERECTOMY     BIOPSY  08/22/2019   Procedure: BIOPSY;  Surgeon: Rogene Houston, MD;  Location: [facility];  Service: Endoscopy;;   CATARACT EXTRACTION W/PHACO Left 11/04/2021   Procedure: CATARACT EXTRACTION PHACO AND INTRAOCULAR LENS PLACEMENT ([place]);  Surgeon: Baruch Goldmann, MD;  Location: [facility] ORS;  Service: Ophthalmology;  Laterality: Left;  CDE: 5.28   CATARACT EXTRACTION W/PHACO Right 11/18/2021   Procedure: CATARACT EXTRACTION PHACO AND INTRAOCULAR LENS PLACEMENT ([facility]);  Surgeon: Baruch Goldmann, MD;  Location: [facility] OR[facility];  Service: Ophthalmology;  Laterality: Right;  CDE: 1.43   COLONOSCOPY  2015   Dr. Britta Mccreedy: multiple polyps (7 in total), one polyp in sigmoid was 1 cm in size. tubular adenomas and serrated adenomas   COLONOSCOPY N/A 08/22/2019   Procedure: COLONOSCOPY;  Surgeon: Rogene Houston, MD;  Location: [facility];  Service: Endoscopy;  Laterality: N/A;  855   COLONOSCOPY WITH PROPOFOL  N/A 12/12/2016   Procedure: COLONOSCOPY WITH PROPOFOL;  Surgeon: Danie Binder, MD;  Location: [facility];  Service: Endoscopy;  Laterality: N/A;  1100   ESOPHAGOGASTRODUODENOSCOPY N/A 08/22/2019   Procedure: ESOPHAGOGASTRODUODENOSCOPY (EGD);  Surgeon: Rogene Houston, MD;  Location: [facility];  Service: Endoscopy;  Laterality: N/A;  855   Social History   Occupational History   Not on file  Tobacco Use   Smoking status: Every Day    Packs/day: 1.00    Years: 20.00    Total pack years: 20.00    Types:  Cigarettes   Smokeless tobacco: Never  Vaping Use   Vaping Use: Never used  Substance and Sexual Activity   Alcohol use: Yes    Alcohol/week: 2.0 standard drinks of alcohol    Types: 1 Glasses of wine, 1 Shots of liquor per week    Comment: occasionally   Drug use: No   Sexual activity: Yes    Birth control/protection: Surgical

## 2022-04-07 ENCOUNTER — Ambulatory Visit: Payer: PPO | Admitting: Orthopedic Surgery

## 2022-04-07 ENCOUNTER — Telehealth: Payer: Self-pay | Admitting: Orthopedic Surgery

## 2022-04-07 NOTE — Telephone Encounter (Signed)
Patient called. Says [facility] did not get the referral for the MRI with a signed MD signature nor a number to send results to. Her call back number is [phone]

## 2022-04-08 ENCOUNTER — Encounter: Payer: Self-pay | Admitting: Orthopedic Surgery

## 2022-04-08 NOTE — Progress Notes (Signed)
Post-Op Visit Note   Patient: Megan Chan           Date of Birth: 24-Oct-1965           MRN: [id number] Visit Date: 04/06/2022 PCP: Manon Hilding, MD   Assessment & Plan:  Chief Complaint:  Chief Complaint  Patient presents with   Right Shoulder - Injury   Visit Diagnoses:  1. Closed fracture of proximal end of right humerus, unspecified fracture morphology, initial encounter     Plan: Megan Chan is a 56 year old patient who sustained right proximal humerus fracture after a fall yesterday.  Was seen by my partner Dr. Lorin Mercy earlier today.  Plain radiographs demonstrate displaced proximal humerus fracture.  Patient is a smoker about 1-1 and half packs per day.  Having a lot of pain in the right shoulder region but denies any other orthopedic complaints.  On examination she does have swelling in the right arm region.  EPL FPL interosseous function intact with intact radial pulse.  Radiographs do demonstrate displaced 2 part proximal humerus fracture.  Plan is CT scan right shoulder.  Operative fixation indicated.  Would like to make sure that the head fragment is indeed only 1 fragment.  Very high risk of avascular necrosis for this fracture in this patient.  The risk and benefits of surgical intervention are discussed with the patient including not limited to infection or vessel damage shoulder stiffness incomplete pain relief as well as the significant possibility of development of avascular necrosis which would require later conversion to reverse shoulder replacement.  Patient understands the risk and benefits and wishes to proceed.  I will call her once I review the CT scan and we will plan for operative fixation next week.  Follow-Up Instructions: No follow-ups on file.   Orders:  Orders Placed This Encounter  Procedures   CT SHOULDER RIGHT WO CONTRAST   No orders of the defined types were placed in this encounter.   Imaging: No results found.  PMFS History: Patient  Active Problem List   Diagnosis Date Noted   Proximal humerus fracture 04/06/2022   Insomnia 10/24/2017   Low vitamin D level 10/24/2017   Numbness 01/15/2017   History of colonic polyps    Colon adenomas 11/08/2016   Right sided abdominal pain 11/08/2016   Tick bite 01/26/2016   High risk medication use 11/24/2014   Neck pain 11/24/2014   Multiple sclerosis ([place]) 08/25/2014   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   Other fatigue 08/25/2014   Depression with anxiety 08/25/2014   Urinary frequency 08/25/2014   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/2014   Past Medical History:  Diagnosis Date   Anxiety    Depression    GERD (gastroesophageal reflux disease)    Hypercholesterolemia    Memory loss    MS (multiple sclerosis) (HCC)    Multiple sclerosis (HCC)    Renal disorder    Trigeminal neuralgia    Trigeminal neuralgia    Vision abnormalities     Family History  Problem Relation Age of Onset   Stroke Mother    Parkinsonism Mother    Colon cancer Mother 81   Alzheimer's disease Father     Past Surgical History:  Procedure Laterality Date   ABDOMINAL HYSTERECTOMY     BIOPSY  08/22/2019   Procedure: BIOPSY;  Surgeon: Rogene Houston, MD;  [facility];  Service: Endoscopy;;   CATARACT EXTRACTION W/PHACO Left 11/04/2021   Procedure: CATARACT EXTRACTION PHACO AND INTRAOCULAR LENS PLACEMENT ([place]);  Surgeon: Baruch Goldmann, MD;  Location: [facility]S;  Service: Ophthalmology;  Laterality: Left;  CDE: 5.28   CATARACT EXTRACTION W/PHACO Right 11/18/2021   Procedure: CATARACT EXTRACTION PHACO AND INTRAOCULAR LENS PLACEMENT ([facility]);  Surgeon: Baruch Goldmann, MD;  Location: [facility];  Service: Ophthalmology;  Laterality: Right;  CDE: 1.43   COLONOSCOPY  2015   Dr. Britta Mccreedy: multiple polyps (7 in total), one polyp in sigmoid was 1 cm in size. tubular adenomas and serrated adenomas   COLONOSCOPY N/A 08/22/2019   Procedure: COLONOSCOPY;  Surgeon: Rogene Houston, MD;  [facility];  Service:  Endoscopy;  Laterality: N/A;  855   COLONOSCOPY WITH PROPOFOL N/A 12/12/2016   Procedure: COLONOSCOPY WITH PROPOFOL;  Surgeon: Danie Binder, MD;  [facility];  Service: Endoscopy;  Laterality: N/A;  1100   ESOPHAGOGASTRODUODENOSCOPY N/A 08/22/2019   Procedure: ESOPHAGOGASTRODUODENOSCOPY (EGD);  Surgeon: Rogene Houston, MD;  [facility];  Service: Endoscopy;  Laterality: N/A;  855   Social History   Occupational History   Not on file  Tobacco Use   Smoking status: Every Day    Packs/day: 1.00    Years: 20.00    Total pack years: 20.00    Types: Cigarettes   Smokeless tobacco: Never  Vaping Use   Vaping Use: Never used  Substance and Sexual Activity   Alcohol use: Yes    Alcohol/week: 2.0 standard drinks of alcohol    Types: 1 Glasses of wine, 1 Shots of liquor per week    Comment: occasionally   Drug use: No   Sexual activity: Yes    Birth control/protection: Surgical

## 2022-04-10 ENCOUNTER — Ambulatory Visit (HOSPITAL_COMMUNITY)
Admission: RE | Admit: 2022-04-10 | Discharge: 2022-04-10 | Disposition: A | Discharge status: Home / Self Care | Payer: PPO | Source: Ambulatory Visit | Attending: Orthopedic Surgery | Admitting: Orthopedic Surgery

## 2022-04-10 DIAGNOSIS — S42201A Unspecified fracture of upper end of right humerus, initial encounter for closed fracture: Secondary | ICD-10-CM | POA: Insufficient documentation

## 2022-04-10 DIAGNOSIS — S42211A Unspecified displaced fracture of surgical neck of right humerus, initial encounter for closed fracture: Secondary | ICD-10-CM | POA: Diagnosis not present

## 2022-04-10 DIAGNOSIS — M25411 Effusion, right shoulder: Secondary | ICD-10-CM | POA: Diagnosis not present

## 2022-04-10 DIAGNOSIS — S42254A Nondisplaced fracture of greater tuberosity of right humerus, initial encounter for closed fracture: Secondary | ICD-10-CM | POA: Diagnosis not present

## 2022-04-10 DIAGNOSIS — Z01818 Encounter for other preprocedural examination: Secondary | ICD-10-CM | POA: Diagnosis not present

## 2022-04-10 NOTE — Telephone Encounter (Signed)
It looks like this pt is sch for a CT scan can you please see message below.

## 2022-04-10 NOTE — Telephone Encounter (Signed)
The CT scan has been completed, we are on epic along with [facility] so there would be an electric signature sent to them and we did not need to send for call report when we can see the completion once read.

## 2022-04-11 NOTE — Telephone Encounter (Signed)
ERROR

## 2022-04-12 ENCOUNTER — Encounter (HOSPITAL_COMMUNITY): Payer: Self-pay | Admitting: Orthopedic Surgery

## 2022-04-12 ENCOUNTER — Other Ambulatory Visit: Payer: Self-pay

## 2022-04-12 NOTE — Anesthesia Preprocedure Evaluation (Signed)
Anesthesia Evaluation  Patient identified by MRN, date of birth, ID band Patient awake    Reviewed: Allergy & Precautions, NPO status , Patient's Chart, lab work & pertinent test results  History of Anesthesia Complications Negative for: history of anesthetic complications  Airway Mallampati: II  TM Distance: >3 FB Neck ROM: Full    Dental  (+) Dental Advisory Given, Caps   Pulmonary Current SmokerPatient did not abstain from smoking.,    breath sounds clear to auscultation       Cardiovascular (-) anginanegative cardio ROS   Rhythm:Regular Rate:Normal     Neuro/Psych Anxiety Depression MS Trigeminal neuralgia    GI/Hepatic Neg liver ROS, GERD  Medicated and Controlled,  Endo/Other  negative endocrine ROS  Renal/GU Renal InsufficiencyRenal disease     Musculoskeletal   Abdominal   Peds  Hematology negative hematology ROS (+)   Anesthesia Other Findings   Reproductive/Obstetrics                           Anesthesia Physical Anesthesia Plan  ASA: 3  Anesthesia Plan: General   Post-op Pain Management: Regional block* and Tylenol PO (pre-op)*   Induction: Intravenous  PONV Risk Score and Plan: 2  Airway Management Planned: Oral ETT  Additional Equipment: None  Intra-op Plan:   Post-operative Plan: Extubation in OR  Informed Consent: I have reviewed the patients History and Physical, chart, labs and discussed the procedure including the risks, benefits and alternatives for the proposed anesthesia with the patient or authorized representative who has indicated his/her understanding and acceptance.     Dental advisory given  Plan Discussed with: CRNA and Surgeon  Anesthesia Plan Comments: (PAT note written 04/12/2022 by Myra Gianotti, PA-C. Plan routine monitors, GETA with interscalene block for post op analgesia)      Anesthesia Quick Evaluation

## 2022-04-12 NOTE — Progress Notes (Signed)
PCP - Dr Consuello Masse Cardiologist - n/a Neurologist - Dr Arlice Colt  Chest x-ray - n/a EKG - n/a Stress Test - n/a ECHO - 04/14/08 Cardiac Cath - n/a  ICD Pacemaker/Loop - n/a  Sleep Study -  n/a CPAP - none  ERAS: Clear liquids til 4:30 AM DOS.  Anesthesia review: Yes  STOP now taking any Aspirin (unless otherwise instructed by your surgeon), Aleve, Naproxen, Ibuprofen, Motrin, Advil, Goody's, BC's, all herbal medications, fish oil, and all vitamins.   Coronavirus Screening Do you have any of the following symptoms:  Cough yes/no: No Fever (>100.54F)  yes/no: No Runny nose yes/no: No Sore throat yes/no: No Difficulty breathing/shortness of breath  yes/no: No  Have you traveled in the last 14 days and where? yes/no: No  Patient verbalized understanding of instructions that were given via phone.

## 2022-04-12 NOTE — Progress Notes (Signed)
Anesthesia Chart Review:  Case: [id number] Date/Time: 04/13/22 0730   Procedure: OPEN REDUCTION INTERNAL FIXATION (ORIF) PROXIMAL HUMERUS FRACTURE (Right)   Anesthesia type: General   Pre-op diagnosis: Right Proximal Humerus Fracture   Location: [facility] OR ADD ON ROOM 01 / [place] [facility]   Surgeons: Meredith Pel, MD       DISCUSSION: Patient is a 56 year old female scheduled for the above procedure.  Patient fell on 04/05/2022 and sustained a closed fracture of her right proximal humerus.  History includes smoking, multiple sclerosis, hypercholesterolemia, GERD, memory loss.  She is a same-day work-up, anesthesia team to evaluate on the day of surgery.   VS:  BP Readings from Last 3 Encounters:  04/05/22 (!) 122/93  01/24/22 (!) 106/55  11/18/21 117/64   Pulse Readings from Last 3 Encounters:  04/05/22 68  01/24/22 66  11/18/21 (!) 55     PROVIDERS: Sasser, Silvestre Moment, MD is PCP  Arlice Colt, MD is neurologist.  Last visit 01/24/2022.  Continue Tecfidera, baclofen as needed, gabapentin, lamotrigine. Follow-up six months planned.    LABS: For labs on arrival as indicated.  Last results in [facility] include: Lab Results  Component Value Date   WBC 6.7 01/24/2022   HGB 13.9 01/24/2022   HCT 40.9 01/24/2022   PLT 213 01/24/2022   GLUCOSE 81 07/19/2021   ALT 95 (H) 01/24/2022   AST 41 (H) 01/24/2022   NA 149 (H) 07/19/2021   K 4.6 07/19/2021   CL 106 07/19/2021   CREATININE 0.64 07/19/2021   BUN 9 07/19/2021   CO2 25 07/19/2021   TSH 2.350 10/24/2017    IMAGES: CT Right shoulder 04/10/22: IMPRESSION: 1. Neer 2 part fracture of the surgical neck of the humerus with posterior displacement of the humeral head component with respect to the humeral shaft component. There are relatively nondisplaced fractures extending into the lower portions the greater and lesser tuberosity, as well as an intermediary anterior fragment of the proximal humeral metaphysis. Joint effusion noted. 2.   Emphysema (ICD10-J43.9).      EKG: Last EKG noted is from 09/15/2020: Sinus rhythm, low voltage in precordial leads.   CV: Stress echo 04/14/08: As outlined by Dr. Kirk Ruths on 04/28/08, "...exercise echocardiogram which was performed on April 14, 2008.  The  patient achieved a maximum workload of 10 METS without chest pain and  had a negative electrocardiographic response.  She also had normal left  ventricular systolic function with no inducible wall motion  abnormalities to suggest ischemia echocardiographically...."    Past Medical History:  Diagnosis Date   Anxiety    Depression    GERD (gastroesophageal reflux disease)    Hypercholesterolemia    Memory loss    MS (multiple sclerosis) (HCC)    Multiple sclerosis ([place])    Renal disorder    Trigeminal neuralgia    Trigeminal neuralgia    Vision abnormalities     Past Surgical History:  Procedure Laterality Date   ABDOMINAL HYSTERECTOMY     BIOPSY  08/22/2019   Procedure: BIOPSY;  Surgeon: Rogene Houston, MD;  [facility];  Service: Endoscopy;;   CATARACT EXTRACTION W/PHACO Left 11/04/2021   Procedure: CATARACT EXTRACTION PHACO AND INTRAOCULAR LENS PLACEMENT ([place]);  Surgeon: Baruch Goldmann, MD;  Location: [facility] ORS;  Service: Ophthalmology;  Laterality: Left;  CDE: 5.28   CATARACT EXTRACTION W/PHACO Right 11/18/2021   Procedure: CATARACT EXTRACTION PHACO AND INTRAOCULAR LENS PLACEMENT ([facility]);  Surgeon: Baruch Goldmann, MD;  Location: [facility];  Service: Ophthalmology;  Laterality: Right;  CDE: 1.43   COLONOSCOPY  2015   Dr. Britta Mccreedy: multiple polyps (7 in total), one polyp in sigmoid was 1 cm in size. tubular adenomas and serrated adenomas   COLONOSCOPY N/A 08/22/2019   Procedure: COLONOSCOPY;  Surgeon: Rogene Houston, MD;  [facility];  Service: Endoscopy;  Laterality: N/A;  855   COLONOSCOPY WITH PROPOFOL N/A 12/12/2016   Procedure: COLONOSCOPY WITH PROPOFOL;  Surgeon: Danie Binder, MD;  [facility];  Service: Endoscopy;  Laterality: N/A;  1100   ESOPHAGOGASTRODUODENOSCOPY N/A 08/22/2019   Procedure: ESOPHAGOGASTRODUODENOSCOPY (EGD);  Surgeon: Rogene Houston, MD;  Location: [facility];  Service: Endoscopy;  Laterality: N/A;  855    MEDICATIONS: No current facility-administered medications for this encounter.    ALPRAZolam (XANAX) 0.5 MG tablet   baclofen (LIORESAL) 10 MG tablet   busPIRone (BUSPAR) 15 MG tablet   Dimethyl Fumarate 240 MG CPDR   FLUoxetine (PROZAC) 20 MG capsule   gabapentin (NEURONTIN) 600 MG tablet   HYDROcodone-acetaminophen (NORCO/VICODIN) 5-325 MG tablet   HYDROcodone-acetaminophen (NORCO/VICODIN) 5-325 MG tablet   hydrOXYzine (ATARAX) 10 MG tablet   lamoTRIgine (LAMICTAL) 200 MG tablet   omeprazole (PRILOSEC) 40 MG capsule   ondansetron (ZOFRAN ODT) 4 MG disintegrating tablet   rosuvastatin (CRESTOR) 10 MG tablet    Myra Gianotti, PA-C Surgical Short Stay/Anesthesiology [facility] Phone [phone] [facility] Phone [phone] 04/12/2022 11:41 AM

## 2022-04-13 ENCOUNTER — Ambulatory Visit (HOSPITAL_COMMUNITY): Payer: PPO

## 2022-04-13 ENCOUNTER — Ambulatory Visit (HOSPITAL_COMMUNITY)
Admission: RE | Admit: 2022-04-13 | Discharge: 2022-04-13 | Disposition: A | Discharge status: Home / Self Care | Payer: PPO | Attending: Orthopedic Surgery | Admitting: Orthopedic Surgery

## 2022-04-13 ENCOUNTER — Other Ambulatory Visit: Payer: Self-pay

## 2022-04-13 ENCOUNTER — Encounter (HOSPITAL_COMMUNITY)
Admission: RE | Disposition: A | Discharge status: Home / Self Care | Payer: Self-pay | Source: Home / Self Care | Attending: Orthopedic Surgery

## 2022-04-13 ENCOUNTER — Ambulatory Visit (HOSPITAL_COMMUNITY): Payer: PPO | Admitting: Vascular Surgery

## 2022-04-13 ENCOUNTER — Encounter (HOSPITAL_COMMUNITY): Payer: Self-pay | Admitting: Orthopedic Surgery

## 2022-04-13 ENCOUNTER — Ambulatory Visit (HOSPITAL_BASED_OUTPATIENT_CLINIC_OR_DEPARTMENT_OTHER): Payer: PPO | Admitting: Vascular Surgery

## 2022-04-13 DIAGNOSIS — S42201A Unspecified fracture of upper end of right humerus, initial encounter for closed fracture: Secondary | ICD-10-CM

## 2022-04-13 DIAGNOSIS — S42211A Unspecified displaced fracture of surgical neck of right humerus, initial encounter for closed fracture: Secondary | ICD-10-CM | POA: Diagnosis not present

## 2022-04-13 DIAGNOSIS — F418 Other specified anxiety disorders: Secondary | ICD-10-CM | POA: Insufficient documentation

## 2022-04-13 DIAGNOSIS — F17210 Nicotine dependence, cigarettes, uncomplicated: Secondary | ICD-10-CM | POA: Diagnosis not present

## 2022-04-13 DIAGNOSIS — Z01818 Encounter for other preprocedural examination: Secondary | ICD-10-CM | POA: Diagnosis not present

## 2022-04-13 DIAGNOSIS — Q600 Renal agenesis, unilateral: Secondary | ICD-10-CM | POA: Diagnosis not present

## 2022-04-13 DIAGNOSIS — N289 Disorder of kidney and ureter, unspecified: Secondary | ICD-10-CM

## 2022-04-13 DIAGNOSIS — G35 Multiple sclerosis: Secondary | ICD-10-CM | POA: Insufficient documentation

## 2022-04-13 DIAGNOSIS — S46011A Strain of muscle(s) and tendon(s) of the rotator cuff of right shoulder, initial encounter: Secondary | ICD-10-CM | POA: Diagnosis not present

## 2022-04-13 DIAGNOSIS — G8918 Other acute postprocedural pain: Secondary | ICD-10-CM | POA: Diagnosis not present

## 2022-04-13 DIAGNOSIS — S42291G Other displaced fracture of upper end of right humerus, subsequent encounter for fracture with delayed healing: Secondary | ICD-10-CM

## 2022-04-13 DIAGNOSIS — W19XXXA Unspecified fall, initial encounter: Secondary | ICD-10-CM | POA: Insufficient documentation

## 2022-04-13 DIAGNOSIS — S46011D Strain of muscle(s) and tendon(s) of the rotator cuff of right shoulder, subsequent encounter: Secondary | ICD-10-CM

## 2022-04-13 DIAGNOSIS — K219 Gastro-esophageal reflux disease without esophagitis: Secondary | ICD-10-CM | POA: Diagnosis not present

## 2022-04-13 HISTORY — PX: ORIF HUMERUS FRACTURE: SHX2126

## 2022-04-13 HISTORY — PX: OPEN REDUCTION INTERNAL FIXATION (ORIF) SCAPHOID WITH ILIAC CREST BONE GRAFT: SHX5668

## 2022-04-13 LAB — CBC
HCT: 40 % (ref 36.0–46.0)
Hemoglobin: 13.1 g/dL (ref 12.0–15.0)
MCH: 31.6 pg (ref 26.0–34.0)
MCHC: 32.8 g/dL (ref 30.0–36.0)
MCV: 96.4 fL (ref 80.0–100.0)
Platelets: 272 10*3/uL (ref 150–400)
RBC: 4.15 MIL/uL (ref 3.87–5.11)
RDW: 13.2 % (ref 11.5–15.5)
WBC: 7.6 10*3/uL (ref 4.0–10.5)
nRBC: 0 % (ref 0.0–0.2)

## 2022-04-13 LAB — BASIC METABOLIC PANEL
Anion gap: 11 (ref 5–15)
BUN: 14 mg/dL (ref 6–20)
CO2: 28 mmol/L (ref 22–32)
Calcium: 10.1 mg/dL (ref 8.9–10.3)
Chloride: 98 mmol/L (ref 98–111)
Creatinine, Ser: 0.58 mg/dL (ref 0.44–1.00)
GFR, Estimated: >60 mL/min (ref >60)
Glucose, Bld: 93 mg/dL (ref 70–99)
Potassium: 4.9 mmol/L (ref 3.5–5.1)
Sodium: 137 mmol/L (ref 135–145)

## 2022-04-13 LAB — SURGICAL PCR SCREEN
MRSA, PCR: NEGATIVE
Staphylococcus aureus: NEGATIVE

## 2022-04-13 LAB — VITAMIN D 25 HYDROXY (VIT D DEFICIENCY, FRACTURES): Vit D, 25-Hydroxy: 30.12 ng/mL (ref 30–100)

## 2022-04-13 SURGERY — OPEN REDUCTION INTERNAL FIXATION (ORIF) PROXIMAL HUMERUS FRACTURE
Anesthesia: General | Laterality: Right

## 2022-04-13 MED ORDER — PROPOFOL 10 MG/ML IV BOLUS
INTRAVENOUS | Status: DC | PRN
  Administered 2022-04-13: 150 mg via INTRAVENOUS

## 2022-04-13 MED ORDER — PROMETHAZINE HCL 25 MG/ML IJ SOLN: 6.2500 mg | INTRAMUSCULAR | Status: DC | PRN

## 2022-04-13 MED ORDER — 0.9 % SODIUM CHLORIDE (POUR BTL) OPTIME
TOPICAL | Status: DC | PRN
  Administered 2022-04-13 (×3): 1000 mL

## 2022-04-13 MED ORDER — BUPIVACAINE LIPOSOME 1.3 % IJ SUSP
INTRAMUSCULAR | Status: DC | PRN
  Administered 2022-04-13: 10 mL via PERINEURAL

## 2022-04-13 MED ORDER — ACETAMINOPHEN 500 MG PO TABS
1000.0000 mg | ORAL_TABLET | Freq: Once | ORAL | Status: AC
  Administered 2022-04-13: 1000 mg via ORAL
  Filled 2022-04-13: qty 2

## 2022-04-13 MED ORDER — MIDAZOLAM HCL 2 MG/2ML IJ SOLN: 0.5000 mg | Freq: Once | INTRAMUSCULAR | Status: DC | PRN

## 2022-04-13 MED ORDER — FENTANYL CITRATE (PF) 250 MCG/5ML IJ SOLN
INTRAMUSCULAR | Status: AC
  Filled 2022-04-13: qty 5

## 2022-04-13 MED ORDER — VANCOMYCIN HCL 1000 MG IV SOLR
INTRAVENOUS | Status: AC
  Filled 2022-04-13: qty 20

## 2022-04-13 MED ORDER — CHLORHEXIDINE GLUCONATE 0.12 % MT SOLN
OROMUCOSAL | Status: AC
  Administered 2022-04-13: 15 mL
  Filled 2022-04-13: qty 15

## 2022-04-13 MED ORDER — BUPIVACAINE-EPINEPHRINE (PF) 0.5% -1:200000 IJ SOLN
INTRAMUSCULAR | Status: DC | PRN
  Administered 2022-04-13: 10 mL via PERINEURAL

## 2022-04-13 MED ORDER — OXYCODONE-ACETAMINOPHEN 5-325 MG PO TABS: 1.0000 | ORAL_TABLET | ORAL | 0 refills | Status: DC | PRN

## 2022-04-13 MED ORDER — LIDOCAINE 2% (20 MG/ML) 5 ML SYRINGE
INTRAMUSCULAR | Status: DC | PRN
  Administered 2022-04-13: 40 mg via INTRAVENOUS

## 2022-04-13 MED ORDER — EPHEDRINE 5 MG/ML INJ
INTRAVENOUS | Status: AC
  Filled 2022-04-13: qty 5

## 2022-04-13 MED ORDER — OXYCODONE HCL 5 MG PO TABS: 5.0000 mg | ORAL_TABLET | Freq: Once | ORAL | Status: DC | PRN

## 2022-04-13 MED ORDER — TRANEXAMIC ACID-NACL 1000-0.7 MG/100ML-% IV SOLN
1000.0000 mg | INTRAVENOUS | Status: AC
  Administered 2022-04-13: 1000 mg via INTRAVENOUS
  Filled 2022-04-13: qty 100

## 2022-04-13 MED ORDER — BUPIVACAINE HCL (PF) 0.25 % IJ SOLN
INTRAMUSCULAR | Status: DC | PRN
  Administered 2022-04-13: 10 mL

## 2022-04-13 MED ORDER — LACTATED RINGERS IV SOLN: INTRAVENOUS | Status: DC | PRN

## 2022-04-13 MED ORDER — SUGAMMADEX SODIUM 200 MG/2ML IV SOLN
INTRAVENOUS | Status: DC | PRN
  Administered 2022-04-13: 150 mg via INTRAVENOUS

## 2022-04-13 MED ORDER — HYDROMORPHONE HCL 1 MG/ML IJ SOLN: 0.2500 mg | INTRAMUSCULAR | Status: DC | PRN

## 2022-04-13 MED ORDER — POVIDONE-IODINE 7.5 % EX SOLN
Freq: Once | CUTANEOUS | Status: DC
  Filled 2022-04-13: qty 118

## 2022-04-13 MED ORDER — VITAMIN D (ERGOCALCIFEROL) 1.25 MG (50000 UNIT) PO CAPS: 50000.0000 [IU] | ORAL_CAPSULE | ORAL | 0 refills | Status: DC

## 2022-04-13 MED ORDER — FENTANYL CITRATE (PF) 100 MCG/2ML IJ SOLN
50.0000 ug | Freq: Once | INTRAMUSCULAR | Status: AC
  Administered 2022-04-13: 50 ug via INTRAVENOUS
  Administered 2022-04-13: 100 ug via INTRAVENOUS

## 2022-04-13 MED ORDER — ROCURONIUM BROMIDE 10 MG/ML (PF) SYRINGE
PREFILLED_SYRINGE | INTRAVENOUS | Status: DC | PRN
  Administered 2022-04-13: 40 mg via INTRAVENOUS

## 2022-04-13 MED ORDER — PROPOFOL 10 MG/ML IV BOLUS
INTRAVENOUS | Status: AC
  Filled 2022-04-13: qty 20

## 2022-04-13 MED ORDER — POVIDONE-IODINE 10 % EX SWAB
2.0000 | Freq: Once | CUTANEOUS | Status: AC
  Administered 2022-04-13: 2 via TOPICAL

## 2022-04-13 MED ORDER — IBUPROFEN 800 MG PO TABS: 800.0000 mg | ORAL_TABLET | Freq: Three times a day (TID) | ORAL | 1 refills | Status: DC | PRN

## 2022-04-13 MED ORDER — MIDAZOLAM HCL 2 MG/2ML IJ SOLN
INTRAMUSCULAR | Status: AC
  Administered 2022-04-13: 1 mg via INTRAVENOUS
  Filled 2022-04-13: qty 2

## 2022-04-13 MED ORDER — VANCOMYCIN HCL 1000 MG IV SOLR
INTRAVENOUS | Status: DC | PRN
  Administered 2022-04-13: 1000 mg via TOPICAL

## 2022-04-13 MED ORDER — FENTANYL CITRATE (PF) 100 MCG/2ML IJ SOLN
INTRAMUSCULAR | Status: AC
  Administered 2022-04-13: 50 ug
  Filled 2022-04-13: qty 2

## 2022-04-13 MED ORDER — BUPIVACAINE HCL (PF) 0.25 % IJ SOLN
INTRAMUSCULAR | Status: AC
  Filled 2022-04-13: qty 30

## 2022-04-13 MED ORDER — MIDAZOLAM HCL 2 MG/2ML IJ SOLN: 1.0000 mg | Freq: Once | INTRAMUSCULAR | Status: AC

## 2022-04-13 MED ORDER — OXYCODONE HCL 5 MG/5ML PO SOLN: 5.0000 mg | Freq: Once | ORAL | Status: DC | PRN

## 2022-04-13 MED ORDER — CEFAZOLIN SODIUM-DEXTROSE 2-4 GM/100ML-% IV SOLN
2.0000 g | INTRAVENOUS | Status: AC
  Administered 2022-04-13: 2 g via INTRAVENOUS
  Filled 2022-04-13: qty 100

## 2022-04-13 MED ORDER — DEXAMETHASONE SODIUM PHOSPHATE 10 MG/ML IJ SOLN
INTRAMUSCULAR | Status: DC | PRN
  Administered 2022-04-13: 10 mg via INTRAVENOUS

## 2022-04-13 MED ORDER — MEPERIDINE HCL 25 MG/ML IJ SOLN: 6.2500 mg | INTRAMUSCULAR | Status: DC | PRN

## 2022-04-13 MED ORDER — ONDANSETRON HCL 4 MG/2ML IJ SOLN
INTRAMUSCULAR | Status: DC | PRN
  Administered 2022-04-13: 4 mg via INTRAVENOUS

## 2022-04-13 MED ORDER — EPHEDRINE SULFATE-NACL 50-0.9 MG/10ML-% IV SOSY
PREFILLED_SYRINGE | INTRAVENOUS | Status: DC | PRN
  Administered 2022-04-13 (×3): 5 mg via INTRAVENOUS

## 2022-04-13 SURGICAL SUPPLY — 86 items
ALCOHOL 70% 16 OZ (MISCELLANEOUS) ×1 IMPLANT
BAG COUNTER SPONGE SURGICOUNT (BAG) ×1 IMPLANT
BENZOIN TINCTURE PRP APPL 2/3 (GAUZE/BANDAGES/DRESSINGS) ×1 IMPLANT
BIT DRILL 3.2 (BIT) ×1
BIT DRILL 3.2XCALB NS DISP (BIT) IMPLANT
BIT DRILL CALIBRATED 2.7 (BIT) IMPLANT
BIT DRL 3.2XCALB NS DISP (BIT) ×1
BNDG COHESIVE 4X5 TAN STRL (GAUZE/BANDAGES/DRESSINGS) ×1 IMPLANT
BNDG ELASTIC 4X5.8 VLCR STR LF (GAUZE/BANDAGES/DRESSINGS) IMPLANT
BNDG ELASTIC 6X5.8 VLCR STR LF (GAUZE/BANDAGES/DRESSINGS) IMPLANT
CHLORAPREP W/TINT 26 (MISCELLANEOUS) ×2 IMPLANT
CNTNR URN SCR LID CUP LEK RST (MISCELLANEOUS) IMPLANT
CONT SPEC 4OZ STRL OR WHT (MISCELLANEOUS) ×2
COVER SURGICAL LIGHT HANDLE (MISCELLANEOUS) ×1 IMPLANT
DRAPE C-ARM 42X72 X-RAY (DRAPES) IMPLANT
DRAPE IMP U-DRAPE 54X76 (DRAPES) ×1 IMPLANT
DRAPE U-SHAPE 47X51 STRL (DRAPES) ×2 IMPLANT
DRSG AQUACEL AG ADV 3.5X10 (GAUZE/BANDAGES/DRESSINGS) IMPLANT
DRSG TEGADERM 2-3/8X2-3/4 SM (GAUZE/BANDAGES/DRESSINGS) IMPLANT
DRSG XEROFORM 1X8 (GAUZE/BANDAGES/DRESSINGS) IMPLANT
ELECT REM PT RETURN 9FT ADLT (ELECTROSURGICAL) ×1
ELECTRODE REM PT RTRN 9FT ADLT (ELECTROSURGICAL) ×1 IMPLANT
GAUZE PAD ABD 8X10 STRL (GAUZE/BANDAGES/DRESSINGS) IMPLANT
GAUZE SPONGE 4X4 12PLY STRL (GAUZE/BANDAGES/DRESSINGS) ×2 IMPLANT
GAUZE XEROFORM 5X9 LF (GAUZE/BANDAGES/DRESSINGS) IMPLANT
GLOVE BIOGEL PI IND STRL 8 (GLOVE) ×1 IMPLANT
GLOVE ECLIPSE 8.0 STRL XLNG CF (GLOVE) ×1 IMPLANT
GOWN STRL REUS W/ TWL LRG LVL3 (GOWN DISPOSABLE) ×2 IMPLANT
GOWN STRL REUS W/ TWL XL LVL3 (GOWN DISPOSABLE) ×1 IMPLANT
GOWN STRL REUS W/TWL LRG LVL3 (GOWN DISPOSABLE) ×2
GOWN STRL REUS W/TWL XL LVL3 (GOWN DISPOSABLE) ×1
HYDROGEN PEROXIDE 16OZ (MISCELLANEOUS) ×1 IMPLANT
JET LAVAGE IRRISEPT WOUND (IRRIGATION / IRRIGATOR) ×1
K-WIRE 2X5 SS THRDED S3 (WIRE) ×4
KIT BASIN OR (CUSTOM PROCEDURE TRAY) ×1 IMPLANT
KIT PLATELET STD 3200 60X6 (KITS) IMPLANT
KIT TURNOVER KIT B (KITS) ×1 IMPLANT
KWIRE 2X5 SS THRDED S3 (WIRE) IMPLANT
LAVAGE JET IRRISEPT WOUND (IRRIGATION / IRRIGATOR) IMPLANT
MANIFOLD NEPTUNE II (INSTRUMENTS) ×1 IMPLANT
NDL HYPO 21X1.5 SAFETY (NEEDLE) IMPLANT
NDL SPNL 18GX3.5 QUINCKE PK (NEEDLE) IMPLANT
NEEDLE HYPO 21X1.5 SAFETY (NEEDLE) ×1 IMPLANT
NEEDLE SPNL 18GX3.5 QUINCKE PK (NEEDLE) ×1 IMPLANT
NS IRRIG 1000ML POUR BTL (IV SOLUTION) ×1 IMPLANT
PACK SHOULDER (CUSTOM PROCEDURE TRAY) ×1 IMPLANT
PAD ARMBOARD 7.5X6 YLW CONV (MISCELLANEOUS) ×2 IMPLANT
PAD CAST 4YDX4 CTTN HI CHSV (CAST SUPPLIES) IMPLANT
PADDING CAST COTTON 4X4 STRL (CAST SUPPLIES)
PEG LOCKING 3.2MMX54 (Peg) IMPLANT
PEG LOCKING 3.2X32 (Peg) IMPLANT
PEG LOCKING 3.2X34 (Screw) IMPLANT
PEG LOCKING 3.2X36 (Screw) IMPLANT
PEG LOCKING 3.2X38 (Screw) IMPLANT
PEG LOCKING 3.2X40 (Peg) IMPLANT
PEG LOCKING 3.2X42 (Screw) IMPLANT
PEG LOCKING 3.2X52 (Peg) IMPLANT
PLATE PROX HUM HI R 3H 80 (Plate) IMPLANT
SCREW LOCK CORT STAR 3.5X24 (Screw) IMPLANT
SCREW LOCK CORT STAR 3.5X26 (Screw) IMPLANT
SCREW LOCK CORT STAR 3.5X34 (Screw) IMPLANT
SCREW LOCK CORT STAR 3.5X48 (Screw) IMPLANT
SCREW LP NL T15 3.5X26 (Screw) IMPLANT
SCREW PEG LOCK 3.2X30MM (Screw) IMPLANT
SCREW T15 LP CORT 3.5X42MM NS (Screw) IMPLANT
SLEEVE MEASURING 3.2 (BIT) IMPLANT
SLING ARM IMMOBILIZER LRG (SOFTGOODS) IMPLANT
SPONGE T-LAP 18X18 ~~LOC~~+RFID (SPONGE) ×1 IMPLANT
SPONGE T-LAP 4X18 ~~LOC~~+RFID (SPONGE) IMPLANT
STAPLER VISISTAT 35W (STAPLE) IMPLANT
STOCKINETTE IMPERVIOUS 9X36 MD (GAUZE/BANDAGES/DRESSINGS) IMPLANT
STRIP CLOSURE SKIN 1/2X4 (GAUZE/BANDAGES/DRESSINGS) IMPLANT
SUCTION FRAZIER HANDLE 10FR (MISCELLANEOUS)
SUCTION TUBE FRAZIER 10FR DISP (MISCELLANEOUS) IMPLANT
SUT MAXBRAID (SUTURE) IMPLANT
SUT MAXBRAID #2 CVD NDL (SUTURE) IMPLANT
SUT MNCRL AB 3-0 PS2 18 (SUTURE) IMPLANT
SUT VIC AB 0 CT1 27 (SUTURE) ×2
SUT VIC AB 0 CT1 27XBRD ANBCTR (SUTURE) IMPLANT
SUT VIC AB 1 CT1 36 (SUTURE) IMPLANT
SUT VIC AB 2-0 CTB1 (SUTURE) IMPLANT
SYR CONTROL 10ML LL (SYRINGE) IMPLANT
TOWEL GREEN STERILE (TOWEL DISPOSABLE) ×1 IMPLANT
TOWEL GREEN STERILE FF (TOWEL DISPOSABLE) ×1 IMPLANT
WATER STERILE IRR 1000ML POUR (IV SOLUTION) ×1 IMPLANT
YANKAUER SUCT BULB TIP NO VENT (SUCTIONS) IMPLANT

## 2022-04-13 NOTE — Anesthesia Procedure Notes (Signed)
Procedure Name: Intubation Date/Time: 04/13/2022 6:10 PM  Performed by: Minerva Ends, CRNAPre-anesthesia Checklist: Patient identified, Emergency Drugs available, Suction available and Patient being monitored Patient Re-evaluated:Patient Re-evaluated prior to induction Oxygen Delivery Method: Circle system utilized Preoxygenation: Pre-oxygenation with 100% oxygen Induction Type: IV induction Ventilation: Mask ventilation without difficulty Laryngoscope Size: Mac and 3 Grade View: Grade I Tube type: Oral Tube size: 7.0 mm Number of attempts: 1 Airway Equipment and Method: Stylet and Oral airway Placement Confirmation: ETT inserted through vocal cords under direct vision, positive ETCO2 and breath sounds checked- equal and bilateral Secured at: 22 cm Tube secured with: Tape Dental Injury: Teeth and Oropharynx as per pre-operative assessment

## 2022-04-13 NOTE — Addendum Note (Signed)
Addendum  created 04/13/22 2247 by Valetta Fuller, CRNA   Attestation recorded in [place], [place] filed

## 2022-04-13 NOTE — H&P (Signed)
Megan Chan is an 56 y.o. female.   Chief Complaint: Right shoulder pain HPI: Megan Chan is a 56 year old patient with right shoulder pain.  8 days ago she sustained a fall with impact onto the right shoulder.  Displaced proximal humerus fracture was noted.  Patient does have a history of multiple sclerosis and she does smoke a pack to a pack and a half a day.  Denies any other orthopedic complaints.  No personal or family history of DVT or pulmonary embolism.  Past Medical History:  Diagnosis Date   Anxiety    Depression    GERD (gastroesophageal reflux disease)    Hypercholesterolemia    Memory loss    mild   Multiple sclerosis (HCC)    Renal disorder    Patient just has one kidney   Trigeminal neuralgia    Trigeminal neuralgia    Vision abnormalities     Past Surgical History:  Procedure Laterality Date   ABDOMINAL HYSTERECTOMY     BIOPSY  08/22/2019   Procedure: BIOPSY;  Surgeon: Rogene Houston, MD;  [facility];  Service: Endoscopy;;   CATARACT EXTRACTION W/PHACO Left 11/04/2021   Procedure: CATARACT EXTRACTION PHACO AND INTRAOCULAR LENS PLACEMENT ([place]);  Surgeon: Baruch Goldmann, MD;  Location: [facility] ORS;  Service: Ophthalmology;  Laterality: Left;  CDE: 5.28   CATARACT EXTRACTION W/PHACO Right 11/18/2021   Procedure: CATARACT EXTRACTION PHACO AND INTRAOCULAR LENS PLACEMENT ([facility]);  Surgeon: Baruch Goldmann, MD;  Location: [facility];  Service: Ophthalmology;  Laterality: Right;  CDE: 1.43   COLONOSCOPY  2015   Dr. Britta Mccreedy: multiple polyps (7 in total), one polyp in sigmoid was 1 cm in size. tubular adenomas and serrated adenomas   COLONOSCOPY N/A 08/22/2019   Procedure: COLONOSCOPY;  Surgeon: Rogene Houston, MD;  [facility];  Service: Endoscopy;  Laterality: N/A;  855   COLONOSCOPY WITH PROPOFOL N/A 12/12/2016   Procedure: COLONOSCOPY WITH PROPOFOL;  Surgeon: Danie Binder, MD;  [facility];  Service: Endoscopy;  Laterality: N/A;  1100    ESOPHAGOGASTRODUODENOSCOPY N/A 08/22/2019   Procedure: ESOPHAGOGASTRODUODENOSCOPY (EGD);  Surgeon: Rogene Houston, MD;  [facility]: [facility];  Service: Endoscopy;  Laterality: N/A;  76    Family History  Problem Relation Age of Onset   Stroke Mother    Parkinsonism Mother    Colon cancer Mother 59   Alzheimer's disease Father    Social History:  reports that she has been smoking cigarettes. She has a 40.00 pack-year smoking history. She has never used smokeless tobacco. She reports that she does not currently use alcohol after a past usage of about 2.0 standard drinks of alcohol per week. She reports that she does not use drugs.  Allergies:  Allergies  Allergen Reactions   Codeine Itching   Sulfa Antibiotics Itching   Penicillins Rash    Has patient had a PCN reaction causing immediate rash, facial/tongue/throat swelling, SOB or lightheadedness with hypotension: Unknown Has patient had a PCN reaction causing severe rash involving mucus membranes or skin necrosis: Unknown Has patient had a PCN reaction that required hospitalization: No Has patient had a PCN reaction occurring within the last 10 years: No If all of the above answers are "NO", then may proceed with Cephalosporin use. Childhood allergy    Medications Prior to Admission  Medication Sig Dispense Refill   ALPRAZolam (XANAX) 0.5 MG tablet Take 0.5 mg by mouth 2 (two) times daily as needed (for major panic attack).  busPIRone (BUSPAR) 15 MG tablet Take 1 tablet by mouth twice daily 180 tablet 1   Dimethyl Fumarate 240 MG CPDR Take 1 capsule by mouth twice per day 60 capsule 11   FLUoxetine (PROZAC) 20 MG capsule Take 60 mg by mouth daily.      gabapentin (NEURONTIN) 600 MG tablet Take 1 tablet by mouth 4 times daily 360 tablet 1   HYDROcodone-acetaminophen (NORCO/VICODIN) 5-325 MG tablet Take 1 tablet by mouth every 6 (six) hours as needed for moderate pain. 16 tablet 0   hydrOXYzine (ATARAX) 10 MG tablet TAKE 1  TABLET BY MOUTH AT BEDTIME 90 tablet 1   lamoTRIgine (LAMICTAL) 200 MG tablet Take 1 tablet (200 mg total) by mouth 2 (two) times daily. 180 tablet 4   omeprazole (PRILOSEC) 40 MG capsule Take 40 mg by mouth 2 (two) times daily.     ondansetron (ZOFRAN ODT) 4 MG disintegrating tablet Take 1 tablet (4 mg total) by mouth every 8 (eight) hours as needed for nausea or vomiting. 30 tablet 1   rosuvastatin (CRESTOR) 10 MG tablet Take 10 mg by mouth at bedtime.      baclofen (LIORESAL) 10 MG tablet Take 1 tablet (10 mg total) by mouth 3 (three) times daily. (Patient not taking: Reported on 04/05/2022) 270 tablet 4   HYDROcodone-acetaminophen (NORCO/VICODIN) 5-325 MG tablet Take 1-2 tablets by mouth every 6 (six) hours as needed for moderate pain. 30 tablet 0    Results for orders placed or performed during the hospital encounter of 04/13/22 (from the past 48 hour(s))  CBC     Status: None   Collection Time: 04/13/22  5:57 AM  Result Value Ref Range   WBC 7.6 4.0 - 10.5 K/uL   RBC 4.15 3.87 - 5.11 MIL/uL   Hemoglobin 13.1 12.0 - 15.0 g/dL   HCT 40.0 36.0 - 46.0 %   MCV 96.4 80.0 - 100.0 fL   MCH 31.6 26.0 - 34.0 pg   MCHC 32.8 30.0 - 36.0 g/dL   RDW 13.2 11.5 - 15.5 %   Platelets 272 150 - 400 K/uL   nRBC 0.0 0.0 - 0.2 %    Comment: Performed at [facility], [place] [address], [place], [place] [address]  Basic metabolic panel     Status: None   Collection Time: 04/13/22  5:57 AM  Result Value Ref Range   Sodium 137 135 - 145 mmol/L   Potassium 4.9 3.5 - 5.1 mmol/L   Chloride 98 98 - 111 mmol/L   CO2 28 22 - 32 mmol/L   Glucose, Bld 93 70 - 99 mg/dL    Comment: Glucose reference range applies only to samples taken after fasting for at least 8 hours.   BUN 14 6 - 20 mg/dL   Creatinine, Ser 0.58 0.44 - 1.00 mg/dL   Calcium 10.1 8.9 - 10.3 mg/dL   GFR, Estimated >60 >60 mL/min    Comment: (NOTE) Calculated using the CKD-EPI Creatinine Equation (2021)    Anion gap 11 5 - 15    Comment:  Performed at [facility] [address] [place], [place] [address]  VITAMIN D 25 Hydroxy (Vit-D Deficiency, Fractures)     Status: None   Collection Time: 04/13/22  5:57 AM  Result Value Ref Range   Vit D, 25-Hydroxy 30.12 30 - 100 ng/mL    Comment: (NOTE) Vitamin D deficiency has been defined by the Institute of Medicine  and an Endocrine Society practice guideline  as a level of serum 25-OH  vitamin D less than 20 ng/mL (1,2). The Endocrine Society went on to  further define vitamin D insufficiency as a level between 21 and 29  ng/mL (2).  1. IOM (Institute of Medicine). 2010. Dietary reference intakes for  calcium and D. [place]: The [employer]. 2. Holick MF, Binkley [place], Bischoff-Ferrari HA, et al. Evaluation,  treatment, and prevention of vitamin D deficiency: an Endocrine  Society clinical practice guideline, JCEM. 2011 Jul; 96(7): 1911-30.  Performed at [facility], [place] [address] [place], [place] [address]   Surgical pcr screen     Status: None   Collection Time: 04/13/22  6:56 AM   Specimen: Nasal Mucosa; Nasal Swab  Result Value Ref Range   MRSA, PCR NEGATIVE NEGATIVE   Staphylococcus aureus NEGATIVE NEGATIVE    Comment: (NOTE) The Xpert SA Assay (FDA approved for NASAL specimens in patients 33 years of age and older), is one component of a comprehensive surveillance program. It is not intended to diagnose infection nor to guide or monitor treatment. Performed at [facility], [place] [address] [place], [place] [address]    No results found.  Review of Systems  Musculoskeletal:  Positive for arthralgias.  All other systems reviewed and are negative.   Blood pressure 132/65, pulse 64, temperature 98.4 F (36.9 C), resp. rate 17, height '5\' 2"'$  (1.575 m), weight 59 kg, SpO2 97 %. Physical Exam Vitals reviewed.  HENT:     Head: Normocephalic.     Nose: Nose normal.     Mouth/Throat:     Mouth: Mucous membranes are moist.   Eyes:     Pupils: Pupils are equal, round, and reactive to light.  Cardiovascular:     Rate and Rhythm: Normal rate.     Pulses: Normal pulses.  Pulmonary:     Effort: Pulmonary effort is normal.  Abdominal:     General: Abdomen is flat.  Musculoskeletal:     Cervical back: Normal range of motion.  Skin:    General: Skin is warm.     Capillary Refill: Capillary refill takes less than 2 seconds.  Neurological:     General: No focal deficit present.     Mental Status: She is alert.  Psychiatric:        Mood and Affect: Mood normal.   Right shoulder examination demonstrates intact EPL FPL interosseous function with palpable radial pulse.  Biceps and triceps fire.  Deltoid is a little bit more difficult to assess due to guarding.  Best assessment is that there is some activity in the deltoid.  Assessment/Plan Impression is displaced proximal humerus fracture in a smoker who has multiple sclerosis.  Plan is right shoulder open reduction internal fixation with plate fixation.  Plan also to use [facility] as biologic jumpstart for healing based on the patient's preoperative smoking status.  The risk and benefits of the procedure discussed with the patient including not limited to infection nerve vessel damage incomplete healing along with avascular necrosis and potential need for further surgery which would be reverse shoulder replacement.  Patient understands risk benefits and wishes to proceed.  All questions answered  Megan Malta, MD 04/13/2022, 5:15 PM

## 2022-04-13 NOTE — Brief Op Note (Signed)
   04/13/2022  9:21 PM  PATIENT:  Megan Chan  56 y.o. female  PRE-OPERATIVE DIAGNOSIS:  Right Proximal Humerus Fracture  POST-OPERATIVE DIAGNOSIS:  Right Proximal Humerus Fracture  PROCEDURE:  Procedure(s): OPEN REDUCTION INTERNAL FIXATION (ORIF) PROXIMAL HUMERUS FRACTURE ILIAC CREST BONE GRAFT  SURGEON:  Surgeon(s): Marlou Sa, Tonna Corner, MD  ASSISTANT: Annie Main, PA  ANESTHESIA:   General  EBL: 75 ml    Total I/O In: 2000 [I.V.:2000] Out: 150 [Blood:150]  BLOOD ADMINISTERED: none  DRAINS: None  LOCAL MEDICATIONS USED: Vancomycin powder  SPECIMEN:  No Specimen  COUNTS:  YES  TOURNIQUET:  * No tourniquets in log *  DICTATION: .Other Dictation: Dictation Number [id number]  PLAN OF CARE: Discharge to home after PACU  PATIENT DISPOSITION:  PACU - hemodynamically stable

## 2022-04-13 NOTE — Transfer of Care (Signed)
Immediate Anesthesia Transfer of Care Note  Patient: Megan Chan  Procedure(s) Performed: OPEN REDUCTION INTERNAL FIXATION (ORIF) PROXIMAL HUMERUS FRACTURE (Right) ILIAC CREST BONE GRAFT (Right)  Patient Location: PACU  Anesthesia Type:GA combined with regional for post-op pain  Level of Consciousness: awake, alert  and oriented  Airway & Oxygen Therapy: Patient Spontanous Breathing   Post-op Assessment: Report given to RN and Post -op Vital signs reviewed and stable  Post vital signs: Reviewed and stable  Last Vitals:  Vitals Value Taken Time  BP 158/103 04/13/22 2124  Temp    Pulse 95 04/13/22 2125  Resp 16 04/13/22 2125  SpO2 88 % 04/13/22 2125  Vitals shown include unvalidated device data.  Last Pain:  Vitals:   04/13/22 0640  TempSrc:   PainSc: 6          Complications: No notable events documented.

## 2022-04-13 NOTE — Anesthesia Procedure Notes (Signed)
Anesthesia Regional Block: Interscalene brachial plexus block   Pre-Anesthetic Checklist: , timeout performed,  Correct Patient, Correct Site, Correct Laterality,  Correct Procedure, Correct Position, site marked,  Risks and benefits discussed,  Surgical consent,  Pre-op evaluation,  At surgeon's request and post-op pain management  Laterality: Right and Upper  Prep: chloraprep       Needles:  Injection technique: Single-shot  Needle Type: Echogenic Needle     Needle Length: 9cm  Needle Gauge: 21     Additional Needles:   Procedures:,,,, ultrasound used (permanent image in chart),,    Narrative:  Start time: 04/13/2022 3:18 PM End time: 04/13/2022 3:25 PM Injection made incrementally with aspirations every 5 mL.  Performed by: Personally  Anesthesiologist: Annye Asa, MD  Additional Notes: Pt identified in Holding room.  Monitors applied. Working IV access confirmed. Sterile prep R clavicle and neck.  #21ga ECHOgenic Arrow block needle to interscalene brachial plexus with US guidance.  10cc 0.5% Bupivacaine 1:200k epi and 10cc Exparel injected incrementally after negative test dose.  Patient asymptomatic, VSS, no heme aspirated, tolerated well.   Jenita Seashore, MD

## 2022-04-13 NOTE — Anesthesia Postprocedure Evaluation (Signed)
Anesthesia Post Note  Patient: Megan Chan  Procedure(s) Performed: OPEN REDUCTION INTERNAL FIXATION (ORIF) PROXIMAL HUMERUS FRACTURE (Right) ILIAC CREST BONE GRAFT (Right)     Patient location during evaluation: PACU Anesthesia Type: General Level of consciousness: awake and alert Pain management: pain level controlled Vital Signs Assessment: post-procedure vital signs reviewed and stable Respiratory status: spontaneous breathing, nonlabored ventilation, respiratory function stable and patient connected to nasal cannula oxygen Cardiovascular status: blood pressure returned to baseline and stable Postop Assessment: no apparent nausea or vomiting Anesthetic complications: no   No notable events documented.  Last Vitals:  Vitals:   04/13/22 2130 04/13/22 2145  BP: 128/61 129/72  Pulse: 77 74  Resp: 18 20  Temp:  36.7 C  SpO2: 96% 98%    Last Pain:  Vitals:   04/13/22 2130  TempSrc:   PainSc: 0-No pain                 Tiajuana Amass

## 2022-04-14 ENCOUNTER — Other Ambulatory Visit: Payer: Self-pay

## 2022-04-14 ENCOUNTER — Telehealth: Payer: Self-pay | Admitting: Orthopedic Surgery

## 2022-04-14 NOTE — Telephone Encounter (Signed)
Patient called asked when should she start moving her arm. Patient asked if it was stitched or glued?   Patient asked how can she get the glue off of her arm or do she need to wait until she sees the doctor. The number to contact patient is [phone]

## 2022-04-14 NOTE — Telephone Encounter (Signed)
Please ask Lurena Joiner to advise.  Thank you

## 2022-04-18 ENCOUNTER — Encounter (HOSPITAL_COMMUNITY): Payer: Self-pay | Admitting: Orthopedic Surgery

## 2022-04-18 NOTE — Telephone Encounter (Signed)
Can we get her a black CPM accordion brace to use for passive motion only? I will give her a call

## 2022-04-19 NOTE — Op Note (Unsigned)
NAMEDORIAN, Megan Chan MEDICAL RECORD NO: [id number] ACCOUNT NO: [id number] DATE OF BIRTH: Feb 20, 1966 FACILITY: [facility] LOCATION: [facility] PHYSICIAN: Yetta Barre. Marlou Sa, MD  Operative Report   DATE OF PROCEDURE: 04/13/2022  PREOPERATIVE DIAGNOSIS:  Right proximal humerus fracture.  POSTOPERATIVE DIAGNOSIS:  Right proximal humerus fracture and small rotator cuff tear of the infraspinatus measuring 1 x 1.5 cm.  PROCEDURE:  Right shoulder proximal humerus fracture open reduction and internal fixation with suture repair of rotator cuff tear.  SURGEON:  Meredith Pel, MD  ASSISTANT:  Annie Main, PA.  INDICATIONS:  The patient is a 56 year old patient who sustained proximal humerus fracture, who presents for operative management after explanation of risks and benefits.  DESCRIPTION OF PROCEDURE:  The patient was brought to the operating room where general endotracheal anesthesia was induced.  Preoperative antibiotics administered.  Timeout was called.  The patient was placed on the handy bed and elevated with the head  elevated about 20 degrees.  Right hip was prescrubbed with alcohol and Betadine, allowed to air dry, prepped with DuraPrep solution and draped in a sterile manner.  Right shoulder also prescrubbed with alcohol and Betadine, allowed to air dry, prepped  with ChloraPrep solution and draped in sterile manner.  Ioban used to cover the operative field on both sides.  After calling a timeout, small incision made around the hip just about 3 fingerbreadths proximal to anterior superior iliac crest.  Jamshidi  needle was placed in between the two tables and bone marrow aspirate was obtained.  This was spun down on the back table to [facility] concentration.  Incision irrigated and anesthetized using Marcaine solution 10 mL and closed using 3-0 nylon sutures and an  impervious dressing applied.  Next, attention directed towards the proximal humerus fracture. Deltopectoral approach was made.   Skin and subcutaneous tissue was sharply divided beginning over the coracoid extending down about 2 cm lateral to the axillary  crease.  Skin and subcutaneous tissue were sharply divided.  Cephalic vein mobilized laterally.  This did sustain an avulsion and it was tied off.  Deltoid was mobilized off of the proximal humerus.  Axillary nerve was palpated and protected at all  times during the case.  Next, the bicipital groove was located.  The rotator interval was opened and the fracture was reduced using a Cobb elevator. Plate was applied laterally and with the fracture reduced through the rotator interval, *** 2 K-wires  were placed through the plate and across the fracture.  Good reduction was confirmed in the AP and lateral planes under fluoroscopy.  Next, the plate was fixed distally and then proximally using a combination of screws and pegs.  Good reduction of the  fracture was observed.  Correct placement of the hardware was confirmed in the AP and lateral planes under fluoroscopy.  A stable reduction achieved.  Thorough irrigation was performed.  It should be noted that the patient did have a 1.5 x 1 cm rotator  cuff tear through which we placed a grasping suture tapes x2 and then threaded those through the plate in order to affect the repair.  This also helped to maintain the humeral head in a valgus position. The patient's arm was taken through range of  motion, found to have good stability.  Hardware was in good position. Thorough irrigation performed.  Vancomycin powder as well as Irrisept solution, which was utilized after the incision as well as at multiple times during the case was also applied.   The deltopectoral interval  was then closed using #1 Vicryl suture followed by interrupted inverted 0 Vicryl suture, 2-0 Vicryl suture, and 3-0 Monocryl with Steri-Strips and Aquacel dressing applied.  The patient tolerated the procedure well without any  immediate complication and transferred to  recovery room in stable condition.  Luke's assistance was required at all times during the case for retraction, opening, closing, mobilization of tissue.  His assistance was a medical necessity.     PAA D: 04/13/2022 9:27:01 pm T: 04/13/2022 11:50:00 pm  JOB: [id number]/ [id number]

## 2022-04-19 NOTE — Telephone Encounter (Signed)
Order sent in 

## 2022-04-21 DIAGNOSIS — S42201A Unspecified fracture of upper end of right humerus, initial encounter for closed fracture: Secondary | ICD-10-CM | POA: Diagnosis not present

## 2022-04-23 DIAGNOSIS — S46011A Strain of muscle(s) and tendon(s) of the rotator cuff of right shoulder, initial encounter: Secondary | ICD-10-CM

## 2022-04-24 ENCOUNTER — Ambulatory Visit (INDEPENDENT_AMBULATORY_CARE_PROVIDER_SITE_OTHER): Payer: PPO

## 2022-04-24 ENCOUNTER — Ambulatory Visit (INDEPENDENT_AMBULATORY_CARE_PROVIDER_SITE_OTHER): Payer: PPO | Admitting: Orthopedic Surgery

## 2022-04-24 DIAGNOSIS — S42201A Unspecified fracture of upper end of right humerus, initial encounter for closed fracture: Secondary | ICD-10-CM

## 2022-04-24 MED ORDER — OXYCODONE HCL 5 MG PO TABS: ORAL_TABLET | ORAL | 0 refills | Status: DC

## 2022-04-25 ENCOUNTER — Other Ambulatory Visit: Payer: Self-pay | Admitting: Neurology

## 2022-04-27 ENCOUNTER — Encounter: Payer: Self-pay | Admitting: Orthopedic Surgery

## 2022-04-27 NOTE — Progress Notes (Signed)
Post-Op Visit Note   Patient: Megan Chan           Date of Birth: 1966/03/20           MRN: [id number] Visit Date: 04/24/2022 PCP: Manon Hilding, MD   Assessment & Plan:  Chief Complaint:  Chief Complaint  Patient presents with   Other    R PROX HUMERUS FX  ORIF  (surgery date 04-13-22)   Visit Diagnoses:  1. Closed fracture of proximal end of right humerus, unspecified fracture morphology, initial encounter     Plan: Megan Chan is a patient underwent right proximal humerus open reduction internal fixation in 2023.  She has a black brace that she is using for range of motion.  Taking oxycodone as needed.  Plan at this time is to start physical therapy 2 times a week for passive range of motion only beginning 9 days from clinic visit.  Refill oxycodone.  No lifting with the right arm.  Deltoid is functional today.  Radiographs look good.  I did tell her this is to be a long-haul in terms of getting her shoulder function back.  We will  Follow-Up Instructions: No follow-ups on file.   Orders:  Orders Placed This Encounter  Procedures   XR Humerus Right   Ambulatory referral to Occupational Therapy   Meds ordered this encounter  Medications   oxyCODONE (OXY IR/ROXICODONE) 5 MG immediate release tablet    Sig: 1 po q 4-6hrs prn pain    Dispense:  30 tablet    Refill:  0    Imaging: No results found.  PMFS History: Patient Active Problem List   Diagnosis Date Noted   Traumatic complete tear of right rotator cuff    Proximal humerus fracture 04/06/2022   Insomnia 10/24/2017   Low vitamin D level 10/24/2017   Numbness 01/15/2017   History of colonic polyps    Colon adenomas 11/08/2016   Right sided abdominal pain 11/08/2016   Tick bite 01/26/2016   High risk medication use 11/24/2014   Neck pain 11/24/2014   Multiple sclerosis ([place]) 08/25/2014   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   Other fatigue 08/25/2014   Depression with anxiety 08/25/2014   Urinary frequency  08/25/2014   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/2014   Past Medical History:  Diagnosis Date   Anxiety    Depression    GERD (gastroesophageal reflux disease)    Hypercholesterolemia    Memory loss    mild   Multiple sclerosis ([place])    Renal disorder    Patient just has one kidney   Trigeminal neuralgia    Trigeminal neuralgia    Vision abnormalities     Family History  Problem Relation Age of Onset   Stroke Mother    Parkinsonism Mother    Colon cancer Mother 55   Alzheimer's disease Father     Past Surgical History:  Procedure Laterality Date   ABDOMINAL HYSTERECTOMY     BIOPSY  08/22/2019   Procedure: BIOPSY;  Surgeon: Rogene Houston, MD;  [facility];  Service: Endoscopy;;   CATARACT EXTRACTION W/PHACO Left 11/04/2021   Procedure: CATARACT EXTRACTION PHACO AND INTRAOCULAR LENS PLACEMENT ([place]);  Surgeon: Baruch Goldmann, MD;  Location: [facility];  Service: Ophthalmology;  Laterality: Left;  CDE: 5.28   CATARACT EXTRACTION W/PHACO Right 11/18/2021   Procedure: CATARACT EXTRACTION PHACO AND INTRAOCULAR LENS PLACEMENT ([facility]);  Surgeon: Baruch Goldmann, MD;  Location: [facility];  Service: Ophthalmology;  Laterality: Right;  CDE: 1.43  COLONOSCOPY  2015   Dr. Britta Mccreedy: multiple polyps (7 in total), one polyp in sigmoid was 1 cm in size. tubular adenomas and serrated adenomas   COLONOSCOPY N/A 08/22/2019   Procedure: COLONOSCOPY;  Surgeon: Rogene Houston, MD;  [facility];  Service: Endoscopy;  Laterality: N/A;  855   COLONOSCOPY WITH PROPOFOL N/A 12/12/2016   Procedure: COLONOSCOPY WITH PROPOFOL;  Surgeon: Danie Binder, MD;  [facility];  Service: Endoscopy;  Laterality: N/A;  1100   ESOPHAGOGASTRODUODENOSCOPY N/A 08/22/2019   Procedure: ESOPHAGOGASTRODUODENOSCOPY (EGD);  Surgeon: Rogene Houston, MD;  [facility];  Service: Endoscopy;  Laterality: N/A;  855   OPEN REDUCTION INTERNAL FIXATION (ORIF) SCAPHOID WITH ILIAC CREST BONE GRAFT Right 04/13/2022    Procedure: ILIAC CREST BONE GRAFT;  Surgeon: Meredith Pel, MD;  Location: [place];  Service: Orthopedics;  Laterality: Right;   ORIF HUMERUS FRACTURE Right 04/13/2022   Procedure: OPEN REDUCTION INTERNAL FIXATION (ORIF) PROXIMAL HUMERUS FRACTURE;  Surgeon: Meredith Pel, MD;  Location: [place];  Service: Orthopedics;  Laterality: Right;   Social History   Occupational History   Not on file  Tobacco Use   Smoking status: Every Day    Packs/day: 1.00    Years: 40.00    Total pack years: 40.00    Types: Cigarettes   Smokeless tobacco: Never  Vaping Use   Vaping Use: Never used  Substance and Sexual Activity   Alcohol use: Not Currently    Alcohol/week: 2.0 standard drinks of alcohol    Types: 1 Glasses of wine, 1 Shots of liquor per week    Comment: occasionally   Drug use: No   Sexual activity: Yes    Birth control/protection: Surgical    Comment: Hysterectomy

## 2022-04-28 ENCOUNTER — Telehealth: Payer: Self-pay | Admitting: Orthopedic Surgery

## 2022-04-28 NOTE — Telephone Encounter (Signed)
Patient called. Would like to speak with Lauren about a letter the insurance company sent over for Dr. Marlou Sa to sign for equipment and it did not get done. Her call back number is [phone]

## 2022-05-03 DIAGNOSIS — S42201A Unspecified fracture of upper end of right humerus, initial encounter for closed fracture: Secondary | ICD-10-CM | POA: Diagnosis not present

## 2022-05-03 NOTE — Telephone Encounter (Signed)
Megan Chan requires shoulder brace for passive range of motion of her right shoulder due to severity of injury and the high incidence of shoulder stiffness following plate fixation of a proximal humerus fracture.  In my opinion a CPM brace will shorten her recovery time to allow her to regain a functional range of motion quicker.  She would benefit from using this for approximately 3 to 4 weeks.  If you have any questions please not hesitate to call thank you Alphonzo Severance

## 2022-05-03 NOTE — Telephone Encounter (Signed)
Note generated. Will submit to ins

## 2022-05-04 ENCOUNTER — Telehealth: Payer: Self-pay | Admitting: Orthopedic Surgery

## 2022-05-04 NOTE — Telephone Encounter (Signed)
Megan Chan  from [employer] [id number].  Wants to know if patient has already receive shoulder brace for passive range of motion before they can approve the claim that was denied.

## 2022-05-04 NOTE — Telephone Encounter (Signed)
IC provided Rob the contact info for [employer] to get issue resolved for patient.

## 2022-05-05 ENCOUNTER — Other Ambulatory Visit: Payer: Self-pay | Admitting: Orthopedic Surgery

## 2022-05-08 ENCOUNTER — Encounter: Payer: Self-pay | Admitting: Orthopedic Surgery

## 2022-05-08 ENCOUNTER — Other Ambulatory Visit: Payer: Self-pay | Admitting: Orthopedic Surgery

## 2022-05-08 ENCOUNTER — Telehealth: Payer: Self-pay | Admitting: Orthopedic Surgery

## 2022-05-08 DIAGNOSIS — S42201A Unspecified fracture of upper end of right humerus, initial encounter for closed fracture: Secondary | ICD-10-CM | POA: Diagnosis not present

## 2022-05-08 MED ORDER — OXYCODONE HCL 5 MG PO CAPS: 5.0000 mg | ORAL_CAPSULE | ORAL | 0 refills | Status: DC | PRN

## 2022-05-08 NOTE — Telephone Encounter (Signed)
Sent thx

## 2022-05-08 NOTE — Telephone Encounter (Signed)
Patient called. Would like a refill on oxycodone. Her call back number is [phone]

## 2022-05-09 NOTE — Telephone Encounter (Signed)
No suture removal required only removal of aquacell if it is still on - closed with monocryl

## 2022-05-11 ENCOUNTER — Other Ambulatory Visit: Payer: Self-pay | Admitting: Surgical

## 2022-05-11 ENCOUNTER — Telehealth: Payer: Self-pay | Admitting: Orthopedic Surgery

## 2022-05-11 DIAGNOSIS — S42201A Unspecified fracture of upper end of right humerus, initial encounter for closed fracture: Secondary | ICD-10-CM | POA: Diagnosis not present

## 2022-05-11 MED ORDER — OXYCODONE HCL 5 MG PO TABS: 5.0000 mg | ORAL_TABLET | ORAL | 0 refills | Status: DC | PRN

## 2022-05-11 NOTE — Telephone Encounter (Signed)
Pt called requesting dr Marlou Sa to call pt insurance for them to release to her. She states pharmacy states to soon for insurance to pay and stated she need to contact her dr and have him call and state it is medically necessary for her pain medication. Please call pt at [phone].

## 2022-05-11 NOTE — Telephone Encounter (Signed)
We are going to stay with oxycodone 5 mg 1 p.o. every 3 to 4 hours as needed pain #60 with no refills.  Thanks okay to fill early 1 time only

## 2022-05-11 NOTE — Telephone Encounter (Signed)
Sent in

## 2022-05-12 NOTE — Telephone Encounter (Signed)
Tried calling. New rx was submitted yesterday.

## 2022-05-15 ENCOUNTER — Ambulatory Visit (INDEPENDENT_AMBULATORY_CARE_PROVIDER_SITE_OTHER): Payer: PPO

## 2022-05-15 ENCOUNTER — Ambulatory Visit (INDEPENDENT_AMBULATORY_CARE_PROVIDER_SITE_OTHER): Payer: PPO | Admitting: Surgical

## 2022-05-15 DIAGNOSIS — S42201A Unspecified fracture of upper end of right humerus, initial encounter for closed fracture: Secondary | ICD-10-CM

## 2022-05-15 DIAGNOSIS — M25521 Pain in right elbow: Secondary | ICD-10-CM

## 2022-05-17 ENCOUNTER — Encounter: Payer: Self-pay | Admitting: Orthopedic Surgery

## 2022-05-17 NOTE — Progress Notes (Signed)
Post-Op Visit Note   Patient: Megan Chan           Date of Birth: Nov 04, 1965           MRN: [id number] Visit Date: 05/15/2022 PCP: Manon Hilding, MD   Assessment & Plan:  Chief Complaint:  Chief Complaint  Patient presents with   Other    04/13/22 right prox humerus fx ORIF   Visit Diagnoses:  1. Closed fracture of proximal end of right humerus, unspecified fracture morphology, initial encounter   2. Pain in right elbow     Plan: Patient is a 56 year old female who presents s/p ORIF of right proximal humerus fracture on 04/13/2022.  She states that her right shoulder pain is improving but still persisting.  She notes right elbow pain with swelling as well.  She denies any fevers or chills.  No repeat injury.  She is working with physical therapy 2 times per week and even at [facility] physical therapy mostly focusing on range of motion.  Still not lifting anything heavy with the operative arm.  Not using sling.  On exam, patient has 20 degrees external rotation, 60 degrees abduction, 90 degrees forward flexion.  Incision is well-healed without evidence of infection or dehiscence.  The incision from the bone marrow aspirate from the right iliac crest was still with sutures so these were removed today.  There was a little bit of redness around there but no sign of infection, just seem like irritation from the sutures.  Axillary nerve is intact with deltoid firing in the operative arm.  2+ radial pulse of the operative arm.  Intact EPL, FPL, finger abduction, finger abduction, pronation/supination, bicep, tricep, deltoid.  Decent rotator cuff strength of supra, infra, subscap.  She does have some olecranon bursitis of the right elbow that is nontender to palpation.  There is no associated redness, warmth, induration.  She does have some tenderness over the lateral and medial epicondyles, primarily over the lateral epicondyle.  This is reproduced with grip strength  testing.  Radiographs of the right humerus and right elbow taken today demonstrate no fracture of the right elbow.  There is soft tissue swelling noted around the olecranon consistent with olecranon bursitis.  No evidence of septic bursitis.  Right humerus radiographs demonstrate hardware from proximal humerus ORIF that remains in good position with no evidence of hardware failure.  There is no penetration of the pegs/screws through the joint surface.  Plan is to continue with range of motion and physical therapy.  Follow-up for clinical recheck with Dr. Marlou Sa in 4 weeks.  No lifting with the operative arm more than 1 pound.  Okay for passive range of motion and active assist range of motion.  Follow-Up Instructions: No follow-ups on file.   Orders:  Orders Placed This Encounter  Procedures   XR Humerus Right   XR Elbow 2 Views Right   No orders of the defined types were placed in this encounter.   Imaging: No results found.  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   Other fatigue 08/25/2014   Depression with anxiety 08/25/2014   Urinary frequency 08/25/2014   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/2014   Past Medical History:  Diagnosis Date   Anxiety    Depression    GERD (gastroesophageal reflux disease)    Hypercholesterolemia    Memory loss    mild   Multiple sclerosis ([facility])    Renal disorder    Patient just has one kidney   Trigeminal neuralgia    Trigeminal neuralgia    Vision abnormalities     Family History  Problem Relation Age of Onset    Stroke Mother    Parkinsonism Mother    Colon cancer Mother 58   Alzheimer's disease Father     Past Surgical History:  Procedure Laterality Date   ABDOMINAL HYSTERECTOMY     BIOPSY  08/22/2019   Procedure: BIOPSY;  Surgeon: Rogene Houston, MD;  [facility];  Service: Endoscopy;;   CATARACT EXTRACTION W/PHACO Left 11/04/2021   Procedure: CATARACT EXTRACTION PHACO AND INTRAOCULAR LENS PLACEMENT ([place]);  Surgeon: Baruch Goldmann, MD;  [facility];  Service: Ophthalmology;  Laterality: Left;  CDE: 5.28   CATARACT EXTRACTION W/PHACO Right 11/18/2021   Procedure: CATARACT EXTRACTION PHACO AND INTRAOCULAR LENS PLACEMENT ([facility]);  Surgeon: Baruch Goldmann, MD;  [facility];  Service: Ophthalmology;  Laterality: Right;  CDE: 1.43   COLONOSCOPY  2015   Dr. Britta Mccreedy: multiple polyps (7 in total), one polyp in sigmoid was 1 cm in size. tubular adenomas and serrated adenomas   COLONOSCOPY N/A 08/22/2019   Procedure: COLONOSCOPY;  Surgeon: Rogene Houston, MD;  [facility];  Service: Endoscopy;  Laterality: N/A;  855   COLONOSCOPY WITH PROPOFOL N/A 12/12/2016   Procedure: COLONOSCOPY WITH PROPOFOL;  Surgeon: Danie Binder, MD;  [facility];  Service: Endoscopy;  Laterality: N/A;  1100   ESOPHAGOGASTRODUODENOSCOPY N/A 08/22/2019   Procedure: ESOPHAGOGASTRODUODENOSCOPY (EGD);  Surgeon: Rogene Houston, MD;  [facility];  Service: Endoscopy;  Laterality: N/A;  855   OPEN REDUCTION INTERNAL FIXATION (ORIF) SCAPHOID WITH ILIAC CREST BONE GRAFT Right 04/13/2022   Procedure: ILIAC CREST BONE GRAFT;  Surgeon: Meredith Pel, MD;  Location: [place];  Service: Orthopedics;  Laterality: Right;   ORIF HUMERUS FRACTURE Right 04/13/2022   Procedure: OPEN REDUCTION INTERNAL FIXATION (ORIF) PROXIMAL HUMERUS FRACTURE;  Surgeon: Meredith Pel, MD;  Location: [place];  Service: Orthopedics;  Laterality: Right;   Social History   Occupational History   Not on file   Tobacco Use   Smoking status: Every Day    Packs/day: 1.00    Years: 40.00    Total pack years: 40.00    Types: Cigarettes   Smokeless tobacco: Never  Vaping Use   Vaping Use: Never used  Substance and Sexual Activity   Alcohol use: Not Currently    Alcohol/week: 2.0 standard drinks of alcohol    Types: 1 Glasses of wine, 1 Shots of liquor per week    Comment: occasionally   Drug use: No   Sexual activity: Yes    Birth control/protection: Surgical    Comment: Hysterectomy

## 2022-05-18 DIAGNOSIS — S42201A Unspecified fracture of upper end of right humerus, initial encounter for closed fracture: Secondary | ICD-10-CM | POA: Diagnosis not present

## 2022-05-22 DIAGNOSIS — S42201A Unspecified fracture of upper end of right humerus, initial encounter for closed fracture: Secondary | ICD-10-CM | POA: Diagnosis not present

## 2022-05-25 DIAGNOSIS — S42201A Unspecified fracture of upper end of right humerus, initial encounter for closed fracture: Secondary | ICD-10-CM | POA: Diagnosis not present

## 2022-05-29 DIAGNOSIS — S42201A Unspecified fracture of upper end of right humerus, initial encounter for closed fracture: Secondary | ICD-10-CM | POA: Diagnosis not present

## 2022-06-01 DIAGNOSIS — S42201A Unspecified fracture of upper end of right humerus, initial encounter for closed fracture: Secondary | ICD-10-CM | POA: Diagnosis not present

## 2022-06-05 DIAGNOSIS — S42201A Unspecified fracture of upper end of right humerus, initial encounter for closed fracture: Secondary | ICD-10-CM | POA: Diagnosis not present

## 2022-06-07 DIAGNOSIS — S42201A Unspecified fracture of upper end of right humerus, initial encounter for closed fracture: Secondary | ICD-10-CM | POA: Diagnosis not present

## 2022-06-12 DIAGNOSIS — S42201A Unspecified fracture of upper end of right humerus, initial encounter for closed fracture: Secondary | ICD-10-CM | POA: Diagnosis not present

## 2022-06-13 ENCOUNTER — Telehealth: Payer: Self-pay | Admitting: *Deleted

## 2022-06-13 NOTE — Telephone Encounter (Signed)
Faxed completed/signed PA dimethyl fumarate to [employer] at [phone]. Received fax confirmation, waiting on determination.

## 2022-06-13 NOTE — Telephone Encounter (Signed)
PA for dimethyl fumarate '240mg'$  was approved by [employer] from 07/17/2022-08/16/2022. Request ID: [id number].

## 2022-06-14 ENCOUNTER — Ambulatory Visit (INDEPENDENT_AMBULATORY_CARE_PROVIDER_SITE_OTHER): Payer: PPO | Admitting: Orthopedic Surgery

## 2022-06-14 ENCOUNTER — Ambulatory Visit (INDEPENDENT_AMBULATORY_CARE_PROVIDER_SITE_OTHER): Payer: PPO

## 2022-06-14 DIAGNOSIS — S42201A Unspecified fracture of upper end of right humerus, initial encounter for closed fracture: Secondary | ICD-10-CM

## 2022-06-15 ENCOUNTER — Encounter: Payer: Self-pay | Admitting: Orthopedic Surgery

## 2022-06-15 DIAGNOSIS — S42201A Unspecified fracture of upper end of right humerus, initial encounter for closed fracture: Secondary | ICD-10-CM | POA: Diagnosis not present

## 2022-06-15 NOTE — Progress Notes (Signed)
Post-Op Visit Note   Patient: Megan Chan           Date of Birth: 08-17-1965           MRN: [id number] Visit Date: 06/14/2022 PCP: Manon Hilding, MD   Assessment & Plan:  Chief Complaint:  Chief Complaint  Patient presents with   Right Shoulder - Follow-up    04/13/22 right proximal humerus ORIF   Visit Diagnoses:  1. Closed fracture of proximal end of right humerus, unspecified fracture morphology, initial encounter     Plan: Patient underwent right proximal humerus fracture open reduction internal fixation 04/05/2022.  She is 2 months out.  On examination she has range of motion of 30/90/120.  Still has some episodic pain in the deltoid region.  Radiographs show no change in hardware position.  She likely has a delayed union based on her smoking.  Plan at this time is 6-week return with repeat radiographs and clinical evaluation.  Looking for any lucencies around the screws and pegs.  Recommend less than 5 pounds of resistance work in physical therapy.  Continue with home exercise program to work on motion.  Follow-Up Instructions: No follow-ups on file.   Orders:  Orders Placed This Encounter  Procedures   XR Humerus Right   No orders of the defined types were placed in this encounter.   Imaging: No results found.  PMFS History: Patient Active Problem List   Diagnosis Date Noted   Traumatic complete tear of right rotator cuff    Proximal humerus fracture 04/06/2022   Insomnia 10/24/2017   Low vitamin D level 10/24/2017   Numbness 01/15/2017   History of colonic polyps    Colon adenomas 11/08/2016   Right sided abdominal pain 11/08/2016   Tick bite 01/26/2016   High risk medication use 11/24/2014   Neck pain 11/24/2014   Multiple sclerosis ([place]) 08/25/2014   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   Other fatigue 08/25/2014   Depression with anxiety 08/25/2014   Urinary frequency 08/25/2014   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/2014   Past Medical History:  Diagnosis  Date   Anxiety    Depression    GERD (gastroesophageal reflux disease)    Hypercholesterolemia    Memory loss    mild   Multiple sclerosis ([place])    Renal disorder    Patient just has one kidney   Trigeminal neuralgia    Trigeminal neuralgia    Vision abnormalities     Family History  Problem Relation Age of Onset   Stroke Mother    Parkinsonism Mother    Colon cancer Mother 53   Alzheimer's disease Father     Past Surgical History:  Procedure Laterality Date   ABDOMINAL HYSTERECTOMY     BIOPSY  08/22/2019   Procedure: BIOPSY;  Surgeon: Rogene Houston, MD;  [facility];  Service: Endoscopy;;   CATARACT EXTRACTION W/PHACO Left 11/04/2021   Procedure: CATARACT EXTRACTION PHACO AND INTRAOCULAR LENS PLACEMENT ([place]);  Surgeon: Baruch Goldmann, MD;  [facility];  Service: Ophthalmology;  Laterality: Left;  CDE: 5.28   CATARACT EXTRACTION W/PHACO Right 11/18/2021   Procedure: CATARACT EXTRACTION PHACO AND INTRAOCULAR LENS PLACEMENT ([facility]);  Surgeon: Baruch Goldmann, MD;  [facility];  Service: Ophthalmology;  Laterality: Right;  CDE: 1.43   COLONOSCOPY  2015   Dr. Britta Mccreedy: multiple polyps (7 in total), one polyp in sigmoid was 1 cm in size. tubular adenomas and serrated adenomas   COLONOSCOPY N/A 08/22/2019   Procedure:  COLONOSCOPY;  Surgeon: Rogene Houston, MD;  [facility];  Service: Endoscopy;  Laterality: N/A;  855   COLONOSCOPY WITH PROPOFOL N/A 12/12/2016   Procedure: COLONOSCOPY WITH PROPOFOL;  Surgeon: Danie Binder, MD;  [facility];  Service: Endoscopy;  Laterality: N/A;  1100   ESOPHAGOGASTRODUODENOSCOPY N/A 08/22/2019   Procedure: ESOPHAGOGASTRODUODENOSCOPY (EGD);  Surgeon: Rogene Houston, MD;  [facility];  Service: Endoscopy;  Laterality: N/A;  855   OPEN REDUCTION INTERNAL FIXATION (ORIF) SCAPHOID WITH ILIAC CREST BONE GRAFT Right 04/13/2022   Procedure: ILIAC CREST BONE GRAFT;  Surgeon: Meredith Pel, MD;   Location: [place];  Service: Orthopedics;  Laterality: Right;   ORIF HUMERUS FRACTURE Right 04/13/2022   Procedure: OPEN REDUCTION INTERNAL FIXATION (ORIF) PROXIMAL HUMERUS FRACTURE;  Surgeon: Meredith Pel, MD;  Location: [facility];  Service: Orthopedics;  Laterality: Right;   Social History   Occupational History   Not on file  Tobacco Use   Smoking status: Every Day    Packs/day: 1.00    Years: 40.00    Total pack years: 40.00    Types: Cigarettes   Smokeless tobacco: Never  Vaping Use   Vaping Use: Never used  Substance and Sexual Activity   Alcohol use: Not Currently    Alcohol/week: 2.0 standard drinks of alcohol    Types: 1 Glasses of wine, 1 Shots of liquor per week    Comment: occasionally   Drug use: No   Sexual activity: Yes    Birth control/protection: Surgical    Comment: Hysterectomy

## 2022-06-16 DIAGNOSIS — G35 Multiple sclerosis: Secondary | ICD-10-CM | POA: Diagnosis not present

## 2022-06-16 DIAGNOSIS — Z6823 Body mass index (BMI) 23.0-23.9, adult: Secondary | ICD-10-CM | POA: Diagnosis not present

## 2022-06-16 DIAGNOSIS — J069 Acute upper respiratory infection, unspecified: Secondary | ICD-10-CM | POA: Diagnosis not present

## 2022-06-16 DIAGNOSIS — R030 Elevated blood-pressure reading, without diagnosis of hypertension: Secondary | ICD-10-CM | POA: Diagnosis not present

## 2022-06-16 DIAGNOSIS — Z20828 Contact with and (suspected) exposure to other viral communicable diseases: Secondary | ICD-10-CM | POA: Diagnosis not present

## 2022-06-16 DIAGNOSIS — F17210 Nicotine dependence, cigarettes, uncomplicated: Secondary | ICD-10-CM | POA: Diagnosis not present

## 2022-06-26 DIAGNOSIS — S42201A Unspecified fracture of upper end of right humerus, initial encounter for closed fracture: Secondary | ICD-10-CM | POA: Diagnosis not present

## 2022-06-29 DIAGNOSIS — S42201A Unspecified fracture of upper end of right humerus, initial encounter for closed fracture: Secondary | ICD-10-CM | POA: Diagnosis not present

## 2022-07-03 DIAGNOSIS — S42201A Unspecified fracture of upper end of right humerus, initial encounter for closed fracture: Secondary | ICD-10-CM | POA: Diagnosis not present

## 2022-07-05 DIAGNOSIS — S42201A Unspecified fracture of upper end of right humerus, initial encounter for closed fracture: Secondary | ICD-10-CM | POA: Diagnosis not present

## 2022-07-11 DIAGNOSIS — S42201A Unspecified fracture of upper end of right humerus, initial encounter for closed fracture: Secondary | ICD-10-CM | POA: Diagnosis not present

## 2022-07-13 DIAGNOSIS — S42201A Unspecified fracture of upper end of right humerus, initial encounter for closed fracture: Secondary | ICD-10-CM | POA: Diagnosis not present

## 2022-07-18 DIAGNOSIS — S42201A Unspecified fracture of upper end of right humerus, initial encounter for closed fracture: Secondary | ICD-10-CM | POA: Diagnosis not present

## 2022-07-20 DIAGNOSIS — S42201A Unspecified fracture of upper end of right humerus, initial encounter for closed fracture: Secondary | ICD-10-CM | POA: Diagnosis not present

## 2022-07-24 DIAGNOSIS — S42201A Unspecified fracture of upper end of right humerus, initial encounter for closed fracture: Secondary | ICD-10-CM | POA: Diagnosis not present

## 2022-07-26 ENCOUNTER — Ambulatory Visit (INDEPENDENT_AMBULATORY_CARE_PROVIDER_SITE_OTHER): Payer: PPO

## 2022-07-26 ENCOUNTER — Encounter: Payer: Self-pay | Admitting: Orthopedic Surgery

## 2022-07-26 ENCOUNTER — Ambulatory Visit (INDEPENDENT_AMBULATORY_CARE_PROVIDER_SITE_OTHER): Payer: PPO | Admitting: Orthopedic Surgery

## 2022-07-26 DIAGNOSIS — S42201A Unspecified fracture of upper end of right humerus, initial encounter for closed fracture: Secondary | ICD-10-CM

## 2022-07-26 NOTE — Progress Notes (Signed)
Post-Op Visit Note   Patient: Megan Chan           Date of Birth: 03-Jun-1966           MRN: [id number] Visit Date: 07/26/2022 PCP: Manon Hilding, MD   Assessment & Plan:  Chief Complaint:  Chief Complaint  Patient presents with   Right Arm - Routine Post Op   Visit Diagnoses:  1. Closed fracture of proximal end of right humerus, unspecified fracture morphology, initial encounter     Plan: [name] is a 57 year old patient is now about 3 months out right proximal humerus fracture open reduction internal fixation.  Overall she is doing well with her range of motion.  Physical therapy note is reviewed.  Organ to continue therapy for another 4 weeks.  She has improving strength and also good range of motion at this time.  Current range of motion is 60/100/160.  Still is having slight difficulty getting her arm all the way behind her back on the right compared to the left.  Overall she is in about the top 5% of all patients at this point following proximal humerus fracture open reduction internal fixation in terms of range of motion.  Plan is continue therapy for 1 more month.  No lifting more than approximately 10 pounds for the next 4 weeks.  Follow-up with radiographs in 6 months to make sure she is not developing avascular necrosis of the humeral head.  Follow-Up Instructions: No follow-ups on file.   Orders:  Orders Placed This Encounter  Procedures   XR Humerus Right   No orders of the defined types were placed in this encounter.   Imaging: No results found.  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   Other fatigue 08/25/2014   Depression with anxiety 08/25/2014   Urinary frequency 08/25/2014   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/2014   Past Medical History:  Diagnosis Date   Anxiety    Depression    GERD (gastroesophageal reflux disease)    Hypercholesterolemia    Memory loss    mild   Multiple sclerosis ([place])    Renal disorder    Patient just has one kidney   Trigeminal neuralgia    Trigeminal neuralgia    Vision abnormalities     Family History  Problem Relation Age of Onset   Stroke Mother    Parkinsonism Mother    Colon cancer Mother 100   Alzheimer's disease Father     Past Surgical History:  Procedure Laterality Date   ABDOMINAL HYSTERECTOMY     BIOPSY  08/22/2019   Procedure: BIOPSY;  Surgeon: Rogene Houston, MD;  [facility];  Service: Endoscopy;;   CATARACT EXTRACTION W/PHACO Left 11/04/2021   Procedure: CATARACT EXTRACTION PHACO AND INTRAOCULAR LENS PLACEMENT ([place]);  Surgeon: Baruch Goldmann, MD;  [facility];  Service: Ophthalmology;  Laterality: Left;  CDE: 5.28   CATARACT EXTRACTION W/PHACO Right 11/18/2021   Procedure: CATARACT EXTRACTION PHACO AND INTRAOCULAR LENS PLACEMENT ([facility]);  Surgeon: Marisa Hua,  Jeneen Rinks, MD;  [facility];  Service: Ophthalmology;  Laterality: Right;  CDE: 1.43   COLONOSCOPY  2015   Dr. Britta Mccreedy: multiple polyps (7 in total), one polyp in sigmoid was 1 cm in size. tubular adenomas and serrated adenomas   COLONOSCOPY N/A 08/22/2019   Procedure: COLONOSCOPY;  Surgeon: Rogene Houston, MD;  [facility];  Service: Endoscopy;  Laterality: N/A;  855   COLONOSCOPY WITH PROPOFOL N/A 12/12/2016   Procedure: COLONOSCOPY WITH PROPOFOL;  Surgeon: Danie Binder, MD;  [facility];  Service: Endoscopy;  Laterality: N/A;  1100   ESOPHAGOGASTRODUODENOSCOPY N/A 08/22/2019   Procedure: ESOPHAGOGASTRODUODENOSCOPY (E[facility]);  Surgeon: Rogene Houston, MD;   [facility];  Service: Endoscopy;  Laterality: N/A;  855   OPEN REDUCTION INTERNAL FIXATION (ORIF) SCAPHOID WITH ILIAC CREST BONE GRAFT Right 04/13/2022   Procedure: ILIAC CREST BONE GRAFT;  Surgeon: Meredith Pel, MD;  Location: [facility];  Service: Orthopedics;  Laterality: Right;   ORIF HUMERUS FRACTURE Right 04/13/2022   Procedure: OPEN REDUCTION INTERNAL FIXATION (ORIF) PROXIMAL HUMERUS FRACTURE;  Surgeon: Meredith Pel, MD;  Location: [facility];  Service: Orthopedics;  Laterality: Right;   Social History   Occupational History   Not on file  Tobacco Use   Smoking status: Every Day    Packs/day: 1.00    Years: 40.00    Total pack years: 40.00    Types: Cigarettes   Smokeless tobacco: Never  Vaping Use   Vaping Use: Never used  Substance and Sexual Activity   Alcohol use: Not Currently    Alcohol/week: 2.0 standard drinks of alcohol    Types: 1 Glasses of wine, 1 Shots of liquor per week    Comment: occasionally   Drug use: No   Sexual activity: Yes    Birth control/protection: Surgical    Comment: Hysterectomy

## 2022-07-27 DIAGNOSIS — S42201A Unspecified fracture of upper end of right humerus, initial encounter for closed fracture: Secondary | ICD-10-CM | POA: Diagnosis not present

## 2022-07-31 DIAGNOSIS — S42201A Unspecified fracture of upper end of right humerus, initial encounter for closed fracture: Secondary | ICD-10-CM | POA: Diagnosis not present

## 2022-08-02 ENCOUNTER — Ambulatory Visit: Payer: PPO | Admitting: Neurology

## 2022-08-02 ENCOUNTER — Encounter: Payer: Self-pay | Admitting: Neurology

## 2022-08-02 VITALS — BP 119/61 | HR 69 | Ht 63.0 in | Wt 129.5 lb

## 2022-08-02 DIAGNOSIS — G35 Multiple sclerosis: Secondary | ICD-10-CM

## 2022-08-02 DIAGNOSIS — G501 Atypical facial pain: Secondary | ICD-10-CM

## 2022-08-02 DIAGNOSIS — R261 Paralytic gait: Secondary | ICD-10-CM

## 2022-08-02 DIAGNOSIS — E559 Vitamin D deficiency, unspecified: Secondary | ICD-10-CM | POA: Diagnosis not present

## 2022-08-02 DIAGNOSIS — Z79899 Other long term (current) drug therapy: Secondary | ICD-10-CM
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
## 2022-08-03 ENCOUNTER — Other Ambulatory Visit: Payer: Self-pay | Admitting: Neurology

## 2022-08-03 DIAGNOSIS — S42201A Unspecified fracture of upper end of right humerus, initial encounter for closed fracture: Secondary | ICD-10-CM | POA: Diagnosis not present

## 2022-08-03 LAB — COMPREHENSIVE METABOLIC PANEL
ALT: 20 IU/L (ref 0–32)
AST: 19 IU/L (ref 0–40)
Albumin/Globulin Ratio: 2 (ref 1.2–2.2)
Albumin: 4.3 g/dL (ref 3.8–4.9)
Alkaline Phosphatase: 139 IU/L — ABNORMAL HIGH (ref 44–121)
BUN/Creatinine Ratio: 19 (ref 9–23)
BUN: 9 mg/dL (ref 6–24)
Bilirubin Total: 0.2 mg/dL (ref 0.0–1.2)
CO2: 22 mmol/L (ref 20–29)
Calcium: 9 mg/dL (ref 8.7–10.2)
Chloride: 104 mmol/L (ref 96–106)
Creatinine, Ser: 0.48 mg/dL — ABNORMAL LOW (ref 0.57–1.00)
Globulin, Total: 2.1 g/dL (ref 1.5–4.5)
Glucose: 108 mg/dL — ABNORMAL HIGH (ref 70–99)
Potassium: 4.4 mmol/L (ref 3.5–5.2)
Sodium: 142 mmol/L (ref 134–144)
Total Protein: 6.4 g/dL (ref 6.0–8.5)
eGFR: 112 mL/min/{1.73_m2} (ref >59)

## 2022-08-03 LAB — CBC WITH DIFFERENTIAL/PLATELET
Basophils Absolute: 0 10*3/uL (ref 0.0–0.2)
Basos: 0 %
EOS (ABSOLUTE): 0.1 10*3/uL (ref 0.0–0.4)
Eos: 2 %
Hematocrit: 40.1 % (ref 34.0–46.6)
Hemoglobin: 14 g/dL (ref 11.1–15.9)
Immature Grans (Abs): 0 10*3/uL (ref 0.0–0.1)
Immature Granulocytes: 0 %
Lymphocytes Absolute: 1.5 10*3/uL (ref 0.7–3.1)
Lymphs: 29 %
MCH: 31.4 pg (ref 26.6–33.0)
MCHC: 34.9 g/dL (ref 31.5–35.7)
MCV: 90 fL (ref 79–97)
Monocytes Absolute: 0.5 10*3/uL (ref 0.1–0.9)
Monocytes: 9 %
Neutrophils Absolute: 3.1 10*3/uL (ref 1.4–7.0)
Neutrophils: 60 %
Platelets: 264 10*3/uL (ref 150–450)
RBC: 4.46 x10E6/uL (ref 3.77–5.28)
RDW: 12.9 % (ref 11.7–15.4)
WBC: 5.3 10*3/uL (ref 3.4–10.8)

## 2022-08-03 LAB — VITAMIN D 25 HYDROXY (VIT D DEFICIENCY, FRACTURES): Vit D, 25-Hydroxy: 19.1 ng/mL — ABNORMAL LOW (ref 30.0–100.0)

## 2022-08-03 MED ORDER — VITAMIN D (ERGOCALCIFEROL) 1.25 MG (50000 UNIT) PO CAPS: 50000.0000 [IU] | ORAL_CAPSULE | ORAL | 3 refills | Status: DC

## 2022-08-07 DIAGNOSIS — S42201A Unspecified fracture of upper end of right humerus, initial encounter for closed fracture: Secondary | ICD-10-CM | POA: Diagnosis not present

## 2022-08-10 DIAGNOSIS — S42201A Unspecified fracture of upper end of right humerus, initial encounter for closed fracture: Secondary | ICD-10-CM | POA: Diagnosis not present

## 2022-08-14 DIAGNOSIS — S42201A Unspecified fracture of upper end of right humerus, initial encounter for closed fracture: Secondary | ICD-10-CM | POA: Diagnosis not present

## 2022-08-17 DIAGNOSIS — S42201A Unspecified fracture of upper end of right humerus, initial encounter for closed fracture: Secondary | ICD-10-CM | POA: Diagnosis not present

## 2022-08-23 DIAGNOSIS — S42201A Unspecified fracture of upper end of right humerus, initial encounter for closed fracture: Secondary | ICD-10-CM | POA: Diagnosis not present

## 2022-08-27 ENCOUNTER — Other Ambulatory Visit: Payer: Self-pay | Admitting: Neurology

## 2022-08-30 ENCOUNTER — Other Ambulatory Visit: Payer: Self-pay | Admitting: Neurology

## 2022-08-30 DIAGNOSIS — G35 Multiple sclerosis: Secondary | ICD-10-CM

## 2022-09-14 ENCOUNTER — Encounter: Payer: Self-pay | Admitting: Radiology

## 2022-09-21 ENCOUNTER — Other Ambulatory Visit: Payer: Self-pay | Admitting: Neurology

## 2022-09-21 ENCOUNTER — Other Ambulatory Visit: Payer: Self-pay

## 2022-09-25 DIAGNOSIS — R945 Abnormal results of liver function studies: Secondary | ICD-10-CM | POA: Diagnosis not present

## 2022-09-25 DIAGNOSIS — E7849 Other hyperlipidemia: Secondary | ICD-10-CM | POA: Diagnosis not present

## 2022-09-25 DIAGNOSIS — R7989 Other specified abnormal findings of blood chemistry: Secondary | ICD-10-CM | POA: Diagnosis not present
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
## 2022-09-27 DIAGNOSIS — F411 Generalized anxiety disorder: Secondary | ICD-10-CM | POA: Diagnosis not present

## 2022-09-27 DIAGNOSIS — E559 Vitamin D deficiency, unspecified: Secondary | ICD-10-CM | POA: Diagnosis not present

## 2022-09-27 DIAGNOSIS — N951 Menopausal and female climacteric states: Secondary | ICD-10-CM | POA: Diagnosis not present

## 2022-09-27 DIAGNOSIS — G500 Trigeminal neuralgia: Secondary | ICD-10-CM | POA: Diagnosis not present

## 2022-09-27 DIAGNOSIS — R7989 Other specified abnormal findings of blood chemistry: Secondary | ICD-10-CM | POA: Diagnosis not present

## 2022-09-27 DIAGNOSIS — R030 Elevated blood-pressure reading, without diagnosis of hypertension: Secondary | ICD-10-CM | POA: Diagnosis not present

## 2022-09-27 DIAGNOSIS — E7849 Other hyperlipidemia: Secondary | ICD-10-CM | POA: Diagnosis not present

## 2022-09-27 DIAGNOSIS — G35 Multiple sclerosis: Secondary | ICD-10-CM | POA: Diagnosis not present

## 2022-09-27 DIAGNOSIS — F4001 Agoraphobia with panic disorder: Secondary | ICD-10-CM | POA: Diagnosis not present

## 2022-09-27 DIAGNOSIS — R911 Solitary pulmonary nodule: Secondary | ICD-10-CM | POA: Diagnosis not present

## 2022-09-27 DIAGNOSIS — D519 Vitamin B12 deficiency anemia, unspecified: Secondary | ICD-10-CM | POA: Diagnosis not present

## 2022-09-27 DIAGNOSIS — R079 Chest pain, unspecified: Secondary | ICD-10-CM | POA: Diagnosis not present

## 2022-10-26 ENCOUNTER — Other Ambulatory Visit: Payer: Self-pay | Admitting: Neurology

## 2022-10-26 ENCOUNTER — Other Ambulatory Visit: Payer: Self-pay

## 2023-01-23 ENCOUNTER — Other Ambulatory Visit: Payer: Self-pay | Admitting: Neurology

## 2023-01-24 ENCOUNTER — Encounter: Payer: Self-pay | Admitting: Orthopedic Surgery

## 2023-01-24 ENCOUNTER — Ambulatory Visit: Payer: PPO | Admitting: Orthopedic Surgery

## 2023-01-24 ENCOUNTER — Other Ambulatory Visit (INDEPENDENT_AMBULATORY_CARE_PROVIDER_SITE_OTHER): Payer: PPO

## 2023-01-24 ENCOUNTER — Other Ambulatory Visit: Payer: Self-pay | Admitting: *Deleted

## 2023-01-24 DIAGNOSIS — S42201A Unspecified fracture of upper end of right humerus, initial encounter for closed fracture: Secondary | ICD-10-CM | POA: Diagnosis not present

## 2023-01-24 NOTE — Telephone Encounter (Signed)
Last seen on 08/02/22 Follow up scheduled on 01/31/23 Last filled on 01/23/23 #270 tablets (90 day supply)

## 2023-01-24 NOTE — Telephone Encounter (Signed)
Pt last seen on 08/02/22 per note "Continue baclofen, gabapentin, lamotrigine " Follow up scheduled on 01/31/23 Last filled on 11/27/22 #360 tablets (90 day supply)

## 2023-01-24 NOTE — Progress Notes (Signed)
Office Visit Note   Patient: Megan Chan           Date of Birth: 12-23-1965           MRN: [id number] Visit Date: 01/24/2023 Requested by: Sasser, Clarene Critchley, MD [address] [place],  [place] [address] PCP: Estanislado Pandy, MD  Subjective: Chief Complaint  Patient presents with   Right Upper Arm - Follow-up    HPI: Megan Chan is a 57 y.o. female who presents to the office reporting right shoulder pain.  Still hurts at times.  Had open reduction internal fixation over 6 months ago.  Has some popping in the shoulder.  Most of her pain is around the deltoid insertion..                ROS: All systems reviewed are negative as they relate to the chief complaint within the history of present illness.  Patient denies fevers or chills.  Assessment & Plan: Visit Diagnoses:  1. Closed fracture of proximal end of right humerus, unspecified fracture morphology, initial encounter     Plan: Impression is right shoulder proximal humerus fracture prevention internal fixation with good healing of the fracture.  No evidence of hardware complication.  Feels like she is having some subacromial popping likely related to the plate.  Not particularly severe on exam.  Strength is good.  No evidence of AVN.  Plan 85-month return for final check and release.  Radiographs at that time  Follow-Up Instructions: No follow-ups on file.   Orders:  Orders Placed This Encounter  Procedures   XR Humerus Right   No orders of the defined types were placed in this encounter.     Procedures: No procedures performed   Clinical Data: No additional findings.  Objective: Vital Signs: There were no vitals taken for this visit.  Physical Exam:  Constitutional: Patient appears well-developed HEENT:  Head: Normocephalic Eyes:EOM are normal Neck: Normal range of motion Cardiovascular: Normal rate Pulmonary/chest: Effort normal Neurologic: Patient is alert Skin: Skin is  warm Psychiatric: Patient has normal mood and affect  Ortho Exam: Ortho exam demonstrates forward flexion and abduction of 160 and 95.  Rotator cuff strength is good infraspinatus supraspinatus and subscap testing.  Slight amount of popping with internal/external Tatian of the arm.  Deltoid is functional.  Incision intact.  Specialty Comments:  No specialty comments available.  Imaging: XR Humerus Right  Result Date: 01/24/2023 2 views right proximal humerus reviewed.  Plate fixation of proximal humerus fracture in good position alignment.  No evidence of avascular necrosis of the humeral head.  Shoulder is located.  No issues with the hardware.  No lucencies around the screws in the shaft.    PMFS History: Patient Active Problem List   Diagnosis Date Noted   Vitamin D deficiency 08/02/2022   Traumatic complete tear of right rotator cuff    Proximal humerus fracture 04/06/2022   Insomnia 10/24/2017   Low vitamin D level 10/24/2017   Numbness 01/15/2017   History of colonic polyps    Colon adenomas 11/08/2016   Right sided abdominal pain 11/08/2016   Tick bite 01/26/2016   High risk medication use 11/24/2014   Neck pain 11/24/2014   Multiple sclerosis (HCC) 08/25/2014   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   Other fatigue 08/25/2014   Depression with anxiety 08/25/2014   Urinary frequency 08/25/2014   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/2014   Past Medical History:  Diagnosis Date   Anxiety  Depression    GERD (gastroesophageal reflux disease)    Hypercholesterolemia    Memory loss    mild   Multiple sclerosis (HCC)    Renal disorder    Patient just has one kidney   Trigeminal neuralgia    Trigeminal neuralgia    Vision abnormalities     Family History  Problem Relation Age of Onset   Stroke Mother    Parkinsonism Mother    Colon cancer Mother 80   Alzheimer's disease Father     Past Surgical History:  Procedure Laterality Date   ABDOMINAL HYSTERECTOMY     BIOPSY  08/22/2019    Procedure: BIOPSY;  Surgeon: Malissa Hippo, MD;  Location: [facility];  Service: Endoscopy;;   CATARACT EXTRACTION W/PHACO Left 11/04/2021   Procedure: CATARACT EXTRACTION PHACO AND INTRAOCULAR LENS PLACEMENT (IOC);  Surgeon: Fabio Pierce, MD;  Location: [facility];  Service: Ophthalmology;  Laterality: Left;  CDE: 5.28   CATARACT EXTRACTION W/PHACO Right 11/18/2021   Procedure: CATARACT EXTRACTION PHACO AND INTRAOCULAR LENS PLACEMENT (IOC);  Surgeon: Fabio Pierce, MD;  Location: [facility];  Service: Ophthalmology;  Laterality: Right;  CDE: 1.43   COLONOSCOPY  2015   Dr. Teena Dunk: multiple polyps (7 in total), one polyp in sigmoid was 1 cm in size. tubular adenomas and serrated adenomas   COLONOSCOPY N/A 08/22/2019   Procedure: COLONOSCOPY;  Surgeon: Malissa Hippo, MD;  Location: [facility];  Service: Endoscopy;  Laterality: N/A;  855   COLONOSCOPY WITH PROPOFOL N/A 12/12/2016   Procedure: COLONOSCOPY WITH PROPOFOL;  Surgeon: West Bali, MD;  Location: [facility];  Service: Endoscopy;  Laterality: N/A;  1100   ESOPHAGOGASTRODUODENOSCOPY N/A 08/22/2019   Procedure: ESOPHAGOGASTRODUODENOSCOPY (EGD);  Surgeon: Malissa Hippo, MD;  Location: [facility];  Service: Endoscopy;  Laterality: N/A;  855   OPEN REDUCTION INTERNAL FIXATION (ORIF) SCAPHOID WITH ILIAC CREST BONE GRAFT Right 04/13/2022   Procedure: ILIAC CREST BONE GRAFT;  Surgeon: Cammy Copa, MD;  Location: [facility] OR;  Service: Orthopedics;  Laterality: Right;   ORIF HUMERUS FRACTURE Right 04/13/2022   Procedure: OPEN REDUCTION INTERNAL FIXATION (ORIF) PROXIMAL HUMERUS FRACTURE;  Surgeon: Cammy Copa, MD;  Location: [facility] OR;  Service: Orthopedics;  Laterality: Right;   Social History   Occupational History   Not on file  Tobacco Use   Smoking status: Every Day    Packs/day: 1.00    Years: 40.00    Additional pack years: 0.00    Total pack years: 40.00    Types: Cigarettes   Smokeless tobacco: Never  Vaping Use    Vaping Use: Never used  Substance and Sexual Activity   Alcohol use: Not Currently    Alcohol/week: 2.0 standard drinks of alcohol    Types: 1 Glasses of wine, 1 Shots of liquor per week    Comment: occasionally   Drug use: No   Sexual activity: Yes    Birth control/protection: Surgical    Comment: Hysterectomy

## 2023-01-30 DIAGNOSIS — Z1231 Encounter for screening mammogram for malignant neoplasm of breast: Secondary | ICD-10-CM | POA: Diagnosis not present

## 2023-01-31 ENCOUNTER — Telehealth: Payer: Self-pay | Admitting: Family Medicine

## 2023-01-31 ENCOUNTER — Encounter: Payer: Self-pay | Admitting: Family Medicine

## 2023-01-31 ENCOUNTER — Ambulatory Visit: Payer: PPO | Admitting: Family Medicine

## 2023-01-31 VITALS — BP 119/76 | HR 55 | Ht 63.0 in | Wt 132.0 lb

## 2023-01-31 DIAGNOSIS — G35 Multiple sclerosis: Secondary | ICD-10-CM | POA: Diagnosis not present

## 2023-01-31 DIAGNOSIS — E559 Vitamin D deficiency, unspecified: Secondary | ICD-10-CM

## 2023-01-31 NOTE — Telephone Encounter (Signed)
 [employer] sent to GI [phone] 

## 2023-01-31 NOTE — Patient Instructions (Addendum)
Below is our plan:  We will continue current treatment plan. I will order MRI of the brain and cervical spine. Let me know if you have any new symptoms.   Please make sure you are staying well hydrated. I recommend 50-60 ounces daily. Well balanced diet and regular exercise encouraged. Consistent sleep schedule with 6-8 hours recommended.   Please continue follow up with care team as directed.   Follow up with Dr Epimenio Foot in 6 months   You may receive a survey regarding today's visit. I encourage you to leave honest feed back as I do use this information to improve patient care. Thank you for seeing me today!
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
## 2023-02-01 LAB — CBC WITH DIFFERENTIAL/PLATELET
Basophils Absolute: 0 10*3/uL (ref 0.0–0.2)
Basos: 0 %
EOS (ABSOLUTE): 0.1 10*3/uL (ref 0.0–0.4)
Eos: 1 %
Hematocrit: 40.7 % (ref 34.0–46.6)
Hemoglobin: 13.9 g/dL (ref 11.1–15.9)
Immature Grans (Abs): 0 10*3/uL (ref 0.0–0.1)
Immature Granulocytes: 0 %
Lymphocytes Absolute: 1.5 10*3/uL (ref 0.7–3.1)
Lymphs: 20 %
MCH: 32 pg (ref 26.6–33.0)
MCHC: 34.2 g/dL (ref 31.5–35.7)
MCV: 94 fL (ref 79–97)
Monocytes Absolute: 0.6 10*3/uL (ref 0.1–0.9)
Monocytes: 8 %
Neutrophils Absolute: 5.5 10*3/uL (ref 1.4–7.0)
Neutrophils: 71 %
Platelets: 193 10*3/uL (ref 150–450)
RBC: 4.34 x10E6/uL (ref 3.77–5.28)
RDW: 13.6 % (ref 11.7–15.4)
WBC: 7.7 10*3/uL (ref 3.4–10.8)

## 2023-02-01 LAB — HEPATIC FUNCTION PANEL
ALT: 33 IU/L — ABNORMAL HIGH (ref 0–32)
AST: 28 IU/L (ref 0–40)
Albumin: 4.3 g/dL (ref 3.8–4.9)
Alkaline Phosphatase: 164 IU/L — ABNORMAL HIGH (ref 44–121)
Bilirubin Total: 0.5 mg/dL (ref 0.0–1.2)
Bilirubin, Direct: 0.11 mg/dL (ref 0.00–0.40)
Total Protein: 6.3 g/dL (ref 6.0–8.5)

## 2023-02-01 LAB — VITAMIN D 25 HYDROXY (VIT D DEFICIENCY, FRACTURES): Vit D, 25-Hydroxy: 81.3 ng/mL (ref 30.0–100.0)

## 2023-03-02 ENCOUNTER — Other Ambulatory Visit: Payer: Self-pay | Admitting: Neurology

## 2023-03-06 NOTE — Telephone Encounter (Signed)
Last seen on 01/31/23 Follow up scheduled on 08/07/23

## 2023-03-23 DIAGNOSIS — E7801 Familial hypercholesterolemia: Secondary | ICD-10-CM | POA: Diagnosis not present

## 2023-03-23 DIAGNOSIS — E7849 Other hyperlipidemia: Secondary | ICD-10-CM | POA: Diagnosis not present

## 2023-03-23 DIAGNOSIS — R945 Abnormal results of liver function studies: Secondary | ICD-10-CM | POA: Diagnosis not present

## 2023-03-24 ENCOUNTER — Ambulatory Visit
Admission: RE | Admit: 2023-03-24 | Discharge: 2023-03-24 | Disposition: A | Discharge status: Home / Self Care | Payer: PPO | Source: Ambulatory Visit | Attending: Family Medicine | Admitting: Family Medicine

## 2023-03-24 DIAGNOSIS — G35 Multiple sclerosis: Secondary | ICD-10-CM

## 2023-03-24 MED ORDER — GADOPICLENOL 0.5 MMOL/ML IV SOLN
7.0000 mL | Freq: Once | INTRAVENOUS | Status: AC | PRN
  Administered 2023-03-24: 7 mL via INTRAVENOUS

## 2023-03-26 ENCOUNTER — Other Ambulatory Visit (HOSPITAL_COMMUNITY): Payer: Self-pay | Admitting: Family Medicine

## 2023-03-26 DIAGNOSIS — D519 Vitamin B12 deficiency anemia, unspecified: Secondary | ICD-10-CM | POA: Diagnosis not present

## 2023-03-26 DIAGNOSIS — G500 Trigeminal neuralgia: Secondary | ICD-10-CM | POA: Diagnosis not present

## 2023-03-26 DIAGNOSIS — G35 Multiple sclerosis: Secondary | ICD-10-CM | POA: Diagnosis not present

## 2023-03-26 DIAGNOSIS — E7849 Other hyperlipidemia: Secondary | ICD-10-CM | POA: Diagnosis not present

## 2023-03-26 DIAGNOSIS — N951 Menopausal and female climacteric states: Secondary | ICD-10-CM | POA: Diagnosis not present

## 2023-03-26 DIAGNOSIS — R079 Chest pain, unspecified: Secondary | ICD-10-CM | POA: Diagnosis not present

## 2023-03-26 DIAGNOSIS — E559 Vitamin D deficiency, unspecified: Secondary | ICD-10-CM | POA: Diagnosis not present

## 2023-03-26 DIAGNOSIS — R911 Solitary pulmonary nodule: Secondary | ICD-10-CM | POA: Diagnosis not present

## 2023-03-26 DIAGNOSIS — F411 Generalized anxiety disorder: Secondary | ICD-10-CM | POA: Diagnosis not present

## 2023-03-26 DIAGNOSIS — F17200 Nicotine dependence, unspecified, uncomplicated: Secondary | ICD-10-CM | POA: Diagnosis not present

## 2023-03-26 DIAGNOSIS — R7989 Other specified abnormal findings of blood chemistry: Secondary | ICD-10-CM | POA: Diagnosis not present

## 2023-03-26 DIAGNOSIS — F4001 Agoraphobia with panic disorder: Secondary | ICD-10-CM | POA: Diagnosis not present

## 2023-04-02 ENCOUNTER — Other Ambulatory Visit: Payer: Self-pay | Admitting: Neurology

## 2023-04-02 NOTE — Telephone Encounter (Signed)
Last seen on 01/31/23 Follow up scheduled on 08/07/23

## 2023-04-12 ENCOUNTER — Ambulatory Visit (HOSPITAL_COMMUNITY)
Admission: RE | Admit: 2023-04-12 | Discharge: 2023-04-12 | Disposition: A | Discharge status: Home / Self Care | Payer: PPO | Source: Ambulatory Visit | Attending: Family Medicine | Admitting: Family Medicine

## 2023-04-12 DIAGNOSIS — R911 Solitary pulmonary nodule: Secondary | ICD-10-CM | POA: Insufficient documentation

## 2023-04-12 DIAGNOSIS — I700 Atherosclerosis of aorta: Secondary | ICD-10-CM | POA: Diagnosis not present

## 2023-04-12 DIAGNOSIS — J432 Centrilobular emphysema: Secondary | ICD-10-CM | POA: Diagnosis not present

## 2023-06-10 ENCOUNTER — Other Ambulatory Visit: Payer: Self-pay | Admitting: Neurology

## 2023-06-11 NOTE — Telephone Encounter (Signed)
Last seen on 01/31/23 Follow up scheduled 07/19/23

## 2023-07-03 ENCOUNTER — Other Ambulatory Visit: Payer: Self-pay

## 2023-07-03 MED ORDER — VITAMIN D (ERGOCALCIFEROL) 1.25 MG (50000 UNIT) PO CAPS: 50000.0000 [IU] | ORAL_CAPSULE | ORAL | 0 refills | Status: DC

## 2023-07-24 ENCOUNTER — Other Ambulatory Visit: Payer: Self-pay | Admitting: Neurology

## 2023-07-24 NOTE — Telephone Encounter (Signed)
Last seen on 01/31/23 Follow up scheduled on 08/07/23

## 2023-07-26 DIAGNOSIS — H01002 Unspecified blepharitis right lower eyelid: Secondary | ICD-10-CM | POA: Diagnosis not present

## 2023-07-26 DIAGNOSIS — H01004 Unspecified blepharitis left upper eyelid: Secondary | ICD-10-CM | POA: Diagnosis not present

## 2023-07-26 DIAGNOSIS — H01001 Unspecified blepharitis right upper eyelid: Secondary | ICD-10-CM | POA: Diagnosis not present

## 2023-07-26 DIAGNOSIS — H26493 Other secondary cataract, bilateral: Secondary | ICD-10-CM | POA: Diagnosis not present

## 2023-07-30 ENCOUNTER — Other Ambulatory Visit: Payer: Self-pay | Admitting: Neurology

## 2023-07-30 ENCOUNTER — Other Ambulatory Visit (INDEPENDENT_AMBULATORY_CARE_PROVIDER_SITE_OTHER): Payer: Self-pay

## 2023-07-30 ENCOUNTER — Ambulatory Visit: Payer: PPO | Admitting: Orthopedic Surgery

## 2023-07-30 DIAGNOSIS — G35 Multiple sclerosis: Secondary | ICD-10-CM

## 2023-07-30 DIAGNOSIS — S42201A Unspecified fracture of upper end of right humerus, initial encounter for closed fracture: Secondary | ICD-10-CM

## 2023-07-31 ENCOUNTER — Encounter: Payer: Self-pay | Admitting: Orthopedic Surgery

## 2023-07-31 NOTE — Progress Notes (Signed)
 Office Visit Note   Patient: Megan Chan           Date of Birth: 07/08/1966           MRN: [id number] Visit Date: 07/30/2023 Requested by: Sasser, Deward ORN, MD [address] [place],  [place] [address] PCP: Atilano Deward ORN, MD  Subjective: Chief Complaint  Patient presents with   Right Shoulder - Follow-up    HPI: Megan Chan is a 58 y.o. female who presents to the office reporting continued gradual improvement in her right shoulder symptoms.  She underwent right proximal humerus open reduction internal fixation 04/05/2022.  Patient is a smoker.  We are following her out to make sure she does not help avascular necrosis.  Still reports some occasional aching but overall the trend is towards significant improvement..                ROS: All systems reviewed are negative as they relate to the chief complaint within the history of present illness.  Patient denies fevers or chills.  Assessment & Plan: Visit Diagnoses:  1. Closed fracture of proximal end of right humerus, unspecified fracture morphology, initial encounter     Plan: Impression is no radiographic evidence of AVN and the patient has excellent shoulder range of motion and strength.  Occasional mechanical symptoms consistent with the presence of hardware in the shoulder.  She will follow-up as needed.  If she develops increasing pain or increasing mechanical symptoms she should come back for further evaluation but at this time it looks like she has healed fracture.  No evidence of avascular necrosis.  Follow-Up Instructions: No follow-ups on file.   Orders:  Orders Placed This Encounter  Procedures   XR Humerus Right   No orders of the defined types were placed in this encounter.     Procedures: No procedures performed   Clinical Data: No additional findings.  Objective: Vital Signs: There were no vitals taken for this visit.  Physical Exam:  Constitutional: Patient appears  well-developed HEENT:  Head: Normocephalic Eyes:EOM are normal Neck: Normal range of motion Cardiovascular: Normal rate Pulmonary/chest: Effort normal Neurologic: Patient is alert Skin: Skin is warm Psychiatric: Patient has normal mood and affect  Ortho Exam: Ortho exam demonstrates excellent rotator cuff strength infraspinatus supraspinatus and subscap muscle testing.  She has forward flexion and abduction both above 90 degrees actively and passively.  Minimal crepitus with passive range of motion of the shoulder at 90 degrees of abduction.  Specialty Comments:  No specialty comments available.  Imaging: No results found.   PMFS History: Patient Active Problem List   Diagnosis Date Noted   Vitamin D  deficiency 08/02/2022   Traumatic complete tear of right rotator cuff    Proximal humerus fracture 04/06/2022   Insomnia 10/24/2017   Low vitamin D  level 10/24/2017   Numbness 01/15/2017   History of colonic polyps    Colon adenomas 11/08/2016   Right sided abdominal pain 11/08/2016   Tick bite 01/26/2016   High risk medication use 11/24/2014   Neck pain 11/24/2014   Multiple sclerosis (HCC) 08/25/2014   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   Other fatigue 08/25/2014   Depression with anxiety 08/25/2014   Urinary frequency 08/25/2014   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/2014   Past Medical History:  Diagnosis Date   Anxiety    Depression    GERD (gastroesophageal reflux disease)    Hypercholesterolemia    Memory loss    mild  Multiple sclerosis (HCC)    Renal disorder    Patient just has one kidney   Trigeminal neuralgia    Trigeminal neuralgia    Vision abnormalities     Family History  Problem Relation Age of Onset   Stroke Mother    Parkinsonism Mother    Colon cancer Mother 26   Alzheimer's disease Father     Past Surgical History:  Procedure Laterality Date   ABDOMINAL HYSTERECTOMY     BIOPSY  08/22/2019   Procedure: BIOPSY;  Surgeon: Golda Claudis PENNER, MD;  [facility]: [facility];  Service: Endoscopy;;   CATARACT EXTRACTION W/PHACO Left 11/04/2021   Procedure: CATARACT EXTRACTION PHACO AND INTRAOCULAR LENS PLACEMENT (IOC);  Surgeon: Harrie Agent, MD;  Location: [facility];  Service: Ophthalmology;  Laterality: Left;  CDE: 5.28   CATARACT EXTRACTION W/PHACO Right 11/18/2021   Procedure: CATARACT EXTRACTION PHACO AND INTRAOCULAR LENS PLACEMENT (IOC);  Surgeon: Harrie Agent, MD;  Location: [facility];  Service: Ophthalmology;  Laterality: Right;  CDE: 1.43   COLONOSCOPY  2015   Dr. Donnel: multiple polyps (7 in total), one polyp in sigmoid was 1 cm in size. tubular adenomas and serrated adenomas   COLONOSCOPY N/A 08/22/2019   Procedure: COLONOSCOPY;  Surgeon: Golda Claudis PENNER, MD;  [facility];  Service: Endoscopy;  Laterality: N/A;  855   COLONOSCOPY WITH PROPOFOL  N/A 12/12/2016   Procedure: COLONOSCOPY WITH PROPOFOL ;  Surgeon: Harvey Margo CROME, MD;  [facility]: [facility];  Service: Endoscopy;  Laterality: N/A;  1100   ESOPHAGOGASTRODUODENOSCOPY N/A 08/22/2019   Procedure: ESOPHAGOGASTRODUODENOSCOPY (EGD);  Surgeon: Golda Claudis PENNER, MD;  [facility]: [facility];  Service: Endoscopy;  Laterality: N/A;  855   OPEN REDUCTION INTERNAL FIXATION (ORIF) SCAPHOID WITH ILIAC CREST BONE GRAFT Right 04/13/2022   Procedure: ILIAC CREST BONE GRAFT;  Surgeon: Addie Cordella Hamilton, MD;  Location: [facility] OR;  Service: Orthopedics;  Laterality: Right;   ORIF HUMERUS FRACTURE Right 04/13/2022   Procedure: OPEN REDUCTION INTERNAL FIXATION (ORIF) PROXIMAL HUMERUS FRACTURE;  Surgeon: Addie Cordella Hamilton, MD;  Location: [facility] OR;  Service: Orthopedics;  Laterality: Right;   Social History   Occupational History   Not on file  Tobacco Use   Smoking status: Every Day    Current packs/day: 1.00    Average packs/day: 1 pack/day for 40.0 years (40.0 ttl pk-yrs)    Types: Cigarettes   Smokeless tobacco: Never  Vaping Use   Vaping status: Never Used  Substance and Sexual Activity    Alcohol  use: Not Currently    Alcohol /week: 2.0 standard drinks of alcohol     Types: 1 Glasses of wine, 1 Shots of liquor per week    Comment: occasionally   Drug use: No   Sexual activity: Yes    Birth control/protection: Surgical    Comment: Hysterectomy

## 2023-08-07 ENCOUNTER — Encounter: Payer: Self-pay | Admitting: Neurology

## 2023-08-07 ENCOUNTER — Ambulatory Visit: Payer: PPO | Admitting: Neurology

## 2023-08-07 ENCOUNTER — Other Ambulatory Visit: Payer: Self-pay | Admitting: Neurology

## 2023-08-07 VITALS — BP 111/60 | HR 72 | Ht 62.0 in | Wt 135.5 lb

## 2023-08-07 DIAGNOSIS — G35 Multiple sclerosis: Secondary | ICD-10-CM

## 2023-08-07 DIAGNOSIS — G501 Atypical facial pain: Secondary | ICD-10-CM | POA: Diagnosis not present

## 2023-08-07 DIAGNOSIS — Z79899 Other long term (current) drug therapy: Secondary | ICD-10-CM

## 2023-08-07 DIAGNOSIS — R261 Paralytic gait: Secondary | ICD-10-CM

## 2023-08-07 DIAGNOSIS — E559 Vitamin D deficiency, unspecified: Secondary | ICD-10-CM

## 2023-08-07 MED ORDER — GABAPENTIN 600 MG PO TABS: ORAL_TABLET | ORAL | 3 refills | Status: AC

## 2023-08-07 MED ORDER — BACLOFEN 10 MG PO TABS: 10.0000 mg | ORAL_TABLET | Freq: Three times a day (TID) | ORAL | 3 refills | Status: AC

## 2023-08-07 MED ORDER — BUSPIRONE HCL 15 MG PO TABS: 15.0000 mg | ORAL_TABLET | Freq: Two times a day (BID) | ORAL | 3 refills | Status: AC

## 2023-08-07 MED ORDER — LAMOTRIGINE 200 MG PO TABS: 200.0000 mg | ORAL_TABLET | Freq: Two times a day (BID) | ORAL | 3 refills | Status: DC

## 2023-08-07 MED ORDER — DIMETHYL FUMARATE 240 MG PO CPDR: DELAYED_RELEASE_CAPSULE | ORAL | 11 refills | Status: AC
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
## 2023-08-08 ENCOUNTER — Encounter: Payer: Self-pay | Admitting: Neurology

## 2023-08-08 LAB — CBC WITH DIFFERENTIAL/PLATELET
Basophils Absolute: 0 10*3/uL (ref 0.0–0.2)
Basos: 1 %
EOS (ABSOLUTE): 0.1 10*3/uL (ref 0.0–0.4)
Eos: 2 %
Hematocrit: 39.8 % (ref 34.0–46.6)
Hemoglobin: 13.6 g/dL (ref 11.1–15.9)
Immature Grans (Abs): 0 10*3/uL (ref 0.0–0.1)
Immature Granulocytes: 0 %
Lymphocytes Absolute: 1.6 10*3/uL (ref 0.7–3.1)
Lymphs: 26 %
MCH: 31.9 pg (ref 26.6–33.0)
MCHC: 34.2 g/dL (ref 31.5–35.7)
MCV: 93 fL (ref 79–97)
Monocytes Absolute: 0.5 10*3/uL (ref 0.1–0.9)
Monocytes: 8 %
Neutrophils Absolute: 3.9 10*3/uL (ref 1.4–7.0)
Neutrophils: 63 %
Platelets: 225 10*3/uL (ref 150–450)
RBC: 4.26 x10E6/uL (ref 3.77–5.28)
RDW: 12.4 % (ref 11.7–15.4)
WBC: 6.2 10*3/uL (ref 3.4–10.8)

## 2023-08-08 LAB — VITAMIN D 25 HYDROXY (VIT D DEFICIENCY, FRACTURES): Vit D, 25-Hydroxy: 60.9 ng/mL (ref 30.0–100.0)

## 2023-08-09 NOTE — Telephone Encounter (Signed)
Dr. Epimenio Foot- no mention if you were going to continue refilling hydroxyzine in last note. Ok to refill? If so, can you addend note to include this? Thank you

## 2023-08-13 ENCOUNTER — Telehealth: Payer: Self-pay | Admitting: Neurology

## 2023-08-13 NOTE — Telephone Encounter (Signed)
[facility] [place], [place] - [address]  Reports that the request from 08/07/23 came back to them as a denial.  They are asking a verbal be called in to them at [phone] for the Dimethyl Fumarate 240 MG CPDR

## 2023-08-13 NOTE — Telephone Encounter (Signed)
Called and spoke w/ Brittney. She transferred me to pharmacist, Nicki [place]ara. Provided VO for dimethyl fumarate 240mg  cap, take 1 po BID #60, 11 refills. She verbalized understanding. They will get rx ready for pt.

## 2023-08-13 NOTE — Telephone Encounter (Signed)
Called and spoke with pt about lab results. Relayed labs looked good per Dr. Epimenio Foot. She verbalized understanding.

## 2023-08-20 DIAGNOSIS — H26492 Other secondary cataract, left eye: Secondary | ICD-10-CM | POA: Diagnosis not present

## 2023-09-13 DIAGNOSIS — H26491 Other secondary cataract, right eye: Secondary | ICD-10-CM | POA: Diagnosis not present

## 2023-09-19 ENCOUNTER — Other Ambulatory Visit: Payer: Self-pay | Admitting: Neurology

## 2023-09-25 DIAGNOSIS — R945 Abnormal results of liver function studies: Secondary | ICD-10-CM | POA: Diagnosis not present

## 2023-09-25 DIAGNOSIS — J441 Chronic obstructive pulmonary disease with (acute) exacerbation: Secondary | ICD-10-CM | POA: Diagnosis not present

## 2023-09-25 DIAGNOSIS — E782 Mixed hyperlipidemia: Secondary | ICD-10-CM | POA: Diagnosis not present

## 2023-09-25 DIAGNOSIS — F17200 Nicotine dependence, unspecified, uncomplicated: Secondary | ICD-10-CM | POA: Diagnosis not present

## 2023-09-25 DIAGNOSIS — E7849 Other hyperlipidemia: Secondary | ICD-10-CM | POA: Diagnosis not present

## 2023-10-01 DIAGNOSIS — F17200 Nicotine dependence, unspecified, uncomplicated: Secondary | ICD-10-CM | POA: Diagnosis not present

## 2023-10-01 DIAGNOSIS — E7849 Other hyperlipidemia: Secondary | ICD-10-CM | POA: Diagnosis not present

## 2023-10-01 DIAGNOSIS — E782 Mixed hyperlipidemia: Secondary | ICD-10-CM | POA: Diagnosis not present

## 2023-10-01 DIAGNOSIS — K2100 Gastro-esophageal reflux disease with esophagitis, without bleeding: Secondary | ICD-10-CM | POA: Diagnosis not present

## 2023-10-01 DIAGNOSIS — Z6824 Body mass index (BMI) 24.0-24.9, adult: Secondary | ICD-10-CM | POA: Diagnosis not present

## 2023-10-01 DIAGNOSIS — F418 Other specified anxiety disorders: Secondary | ICD-10-CM | POA: Diagnosis not present

## 2023-10-20 ENCOUNTER — Other Ambulatory Visit: Payer: Self-pay | Admitting: Neurology

## 2023-10-24 ENCOUNTER — Other Ambulatory Visit: Payer: Self-pay | Admitting: Neurology

## 2024-01-28 ENCOUNTER — Other Ambulatory Visit: Payer: Self-pay | Admitting: Neurology

## 2024-01-28 NOTE — Telephone Encounter (Signed)
 Last seen on 08/07/23 Follow up scheduled on 02/11/24  Did you want patient to continue?  Rx pending

## 2024-01-31 DIAGNOSIS — Z1231 Encounter for screening mammogram for malignant neoplasm of breast: Secondary | ICD-10-CM | POA: Diagnosis not present
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
## 2024-02-07 NOTE — Patient Instructions (Signed)
 Below is our plan:  We will continue current treatment plan. Please consider using a cane or walking stick for stability.   Please make sure you are staying well hydrated. I recommend 50-60 ounces daily. Well balanced diet and regular exercise encouraged. Consistent sleep schedule with 6-8 hours recommended.   Please continue follow up with care team as directed.   Follow up with Dr Vear in 6 months   You may receive a survey regarding today's visit. I encourage you to leave honest feed back as I do use this information to improve patient care. Thank you for seeing me today!

## 2024-02-11 ENCOUNTER — Ambulatory Visit: Payer: PPO | Admitting: Family Medicine

## 2024-02-11 ENCOUNTER — Encounter: Payer: Self-pay | Admitting: Family Medicine

## 2024-02-11 VITALS — BP 130/73 | HR 73 | Ht 62.5 in | Wt 136.5 lb

## 2024-02-11 DIAGNOSIS — G35 Multiple sclerosis: Secondary | ICD-10-CM | POA: Diagnosis not present

## 2024-02-11 DIAGNOSIS — E559 Vitamin D deficiency, unspecified: Secondary | ICD-10-CM | POA: Diagnosis not present

## 2024-02-11 DIAGNOSIS — Z79899 Other long term (current) drug therapy: Secondary | ICD-10-CM | POA: Diagnosis not present

## 2024-02-11 DIAGNOSIS — R261 Paralytic gait: Secondary | ICD-10-CM | POA: Diagnosis not present

## 2024-02-12 ENCOUNTER — Ambulatory Visit: Payer: Self-pay | Admitting: Family Medicine

## 2024-02-12 LAB — CBC WITH DIFFERENTIAL/PLATELET
Basophils Absolute: 0 x10E3/uL (ref 0.0–0.2)
Basos: 1 %
EOS (ABSOLUTE): 0.2 x10E3/uL (ref 0.0–0.4)
Eos: 3 %
Hematocrit: 41 % (ref 34.0–46.6)
Hemoglobin: 13.7 g/dL (ref 11.1–15.9)
Immature Grans (Abs): 0 x10E3/uL (ref 0.0–0.1)
Immature Granulocytes: 0 %
Lymphocytes Absolute: 1.7 x10E3/uL (ref 0.7–3.1)
Lymphs: 26 %
MCH: 31.8 pg (ref 26.6–33.0)
MCHC: 33.4 g/dL (ref 31.5–35.7)
MCV: 95 fL (ref 79–97)
Monocytes Absolute: 0.5 x10E3/uL (ref 0.1–0.9)
Monocytes: 8 %
Neutrophils Absolute: 4 x10E3/uL (ref 1.4–7.0)
Neutrophils: 62 %
Platelets: 152 x10E3/uL (ref 150–450)
RBC: 4.31 x10E6/uL (ref 3.77–5.28)
RDW: 13 % (ref 11.7–15.4)
WBC: 6.6 x10E3/uL (ref 3.4–10.8)

## 2024-02-12 LAB — VITAMIN D 25 HYDROXY (VIT D DEFICIENCY, FRACTURES): Vit D, 25-Hydroxy: 41 ng/mL (ref 30.0–100.0)

## 2024-04-01 DIAGNOSIS — Z130 Encounter for screening for diseases of the blood and blood-forming organs and certain disorders involving the immune mechanism: Secondary | ICD-10-CM | POA: Diagnosis not present

## 2024-04-01 DIAGNOSIS — R5383 Other fatigue: Secondary | ICD-10-CM | POA: Diagnosis not present

## 2024-04-01 DIAGNOSIS — E7849 Other hyperlipidemia: Secondary | ICD-10-CM | POA: Diagnosis not present

## 2024-04-01 DIAGNOSIS — R945 Abnormal results of liver function studies: Secondary | ICD-10-CM | POA: Diagnosis not present

## 2024-04-01 DIAGNOSIS — R7989 Other specified abnormal findings of blood chemistry: Secondary | ICD-10-CM | POA: Diagnosis not present

## 2024-04-08 DIAGNOSIS — R7989 Other specified abnormal findings of blood chemistry: Secondary | ICD-10-CM | POA: Diagnosis not present

## 2024-04-08 DIAGNOSIS — K2100 Gastro-esophageal reflux disease with esophagitis, without bleeding: Secondary | ICD-10-CM | POA: Diagnosis not present

## 2024-04-08 DIAGNOSIS — F418 Other specified anxiety disorders: Secondary | ICD-10-CM | POA: Diagnosis not present

## 2024-04-08 DIAGNOSIS — F17210 Nicotine dependence, cigarettes, uncomplicated: Secondary | ICD-10-CM | POA: Diagnosis not present

## 2024-04-08 DIAGNOSIS — G500 Trigeminal neuralgia: Secondary | ICD-10-CM | POA: Diagnosis not present

## 2024-04-08 DIAGNOSIS — Z6823 Body mass index (BMI) 23.0-23.9, adult: Secondary | ICD-10-CM | POA: Diagnosis not present

## 2024-05-19 ENCOUNTER — Encounter: Payer: Self-pay | Admitting: Radiology

## 2024-07-05 ENCOUNTER — Other Ambulatory Visit: Payer: Self-pay | Admitting: Neurology

## 2024-08-07 ENCOUNTER — Encounter (INDEPENDENT_AMBULATORY_CARE_PROVIDER_SITE_OTHER): Payer: Self-pay | Admitting: *Deleted

## 2024-09-18 ENCOUNTER — Ambulatory Visit: Admitting: Neurology
